# Patient Record
Sex: Female | Born: 1935 | Race: Black or African American | Hispanic: No | Marital: Married | Smoking: Current every day smoker
Health system: Southern US, Community
[De-identification: ages and names within clinical notes are randomized; demographics above are authoritative.]

## PROBLEM LIST (undated history)

## (undated) DIAGNOSIS — D509 Iron deficiency anemia, unspecified: Secondary | ICD-10-CM

## (undated) DIAGNOSIS — M1990 Unspecified osteoarthritis, unspecified site: Secondary | ICD-10-CM

## (undated) DIAGNOSIS — T7840XA Allergy, unspecified, initial encounter: Secondary | ICD-10-CM

## (undated) DIAGNOSIS — M549 Dorsalgia, unspecified: Secondary | ICD-10-CM

## (undated) HISTORY — PX: INNER EAR SURGERY: SHX679

## (undated) HISTORY — DX: Unspecified osteoarthritis, unspecified site: M19.90

## (undated) HISTORY — DX: Allergy, unspecified, initial encounter: T78.40XA

---

## 1998-06-23 ENCOUNTER — Emergency Department (HOSPITAL_COMMUNITY): Admission: EM | Admit: 1998-06-23 | Discharge: 1998-06-23 | Payer: Self-pay | Admitting: Emergency Medicine

## 2010-02-12 ENCOUNTER — Emergency Department (HOSPITAL_BASED_OUTPATIENT_CLINIC_OR_DEPARTMENT_OTHER): Admission: EM | Admit: 2010-02-12 | Discharge: 2010-02-12 | Payer: Self-pay | Admitting: Emergency Medicine

## 2010-02-12 ENCOUNTER — Ambulatory Visit: Payer: Self-pay | Admitting: Radiology

## 2010-06-28 LAB — POCT TOXICOLOGY PANEL

## 2010-06-28 LAB — DIFFERENTIAL
Basophils Absolute: 0 10*3/uL (ref 0.0–0.1)
Eosinophils Absolute: 0.1 10*3/uL (ref 0.0–0.7)
Eosinophils Relative: 1 % (ref 0–5)
Lymphocytes Relative: 33 % (ref 12–46)
Monocytes Absolute: 0.4 10*3/uL (ref 0.1–1.0)

## 2010-06-28 LAB — URINE CULTURE

## 2010-06-28 LAB — BASIC METABOLIC PANEL
BUN: 22 mg/dL (ref 6–23)
CO2: 23 mEq/L (ref 19–32)
Chloride: 109 mEq/L (ref 96–112)
Creatinine, Ser: 0.8 mg/dL (ref 0.4–1.2)
Glucose, Bld: 110 mg/dL — ABNORMAL HIGH (ref 70–99)
Potassium: 4.2 mEq/L (ref 3.5–5.1)

## 2010-06-28 LAB — CBC
HCT: 45.3 % (ref 36.0–46.0)
MCH: 31.7 pg (ref 26.0–34.0)
MCV: 91.8 fL (ref 78.0–100.0)
Platelets: 200 10*3/uL (ref 150–400)
RDW: 13.2 % (ref 11.5–15.5)
WBC: 5.5 10*3/uL (ref 4.0–10.5)

## 2010-06-28 LAB — URINE MICROSCOPIC-ADD ON

## 2010-06-28 LAB — URINALYSIS, ROUTINE W REFLEX MICROSCOPIC
Glucose, UA: NEGATIVE mg/dL
Ketones, ur: NEGATIVE mg/dL
Nitrite: NEGATIVE
Specific Gravity, Urine: 1.022 (ref 1.005–1.030)
pH: 5.5 (ref 5.0–8.0)

## 2010-06-28 LAB — ETHANOL: Alcohol, Ethyl (B): <5 mg/dL (ref 0–10)

## 2011-09-20 IMAGING — CT CT HEAD W/O CM
1 series · 16 of 30 positions shown, 20 images · non-contrast
Comparison: None.

CLINICAL DATA: Medical clearance.  Mental status changes.

CT HEAD WITHOUT CONTRAST
TECHNIQUE: Contiguous axial images were obtained from the base of
the skull through the vertex without contrast.

[Series 2: head 4.8 h37s · axial · 0.42mm/px · z∈[-167,-34]mm · 16 of 32 slices shown, 20 images]
[im 2/32  brain]
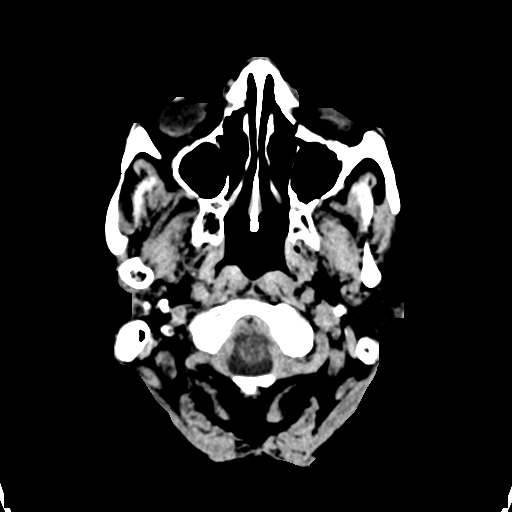
[im 2/32  bone]
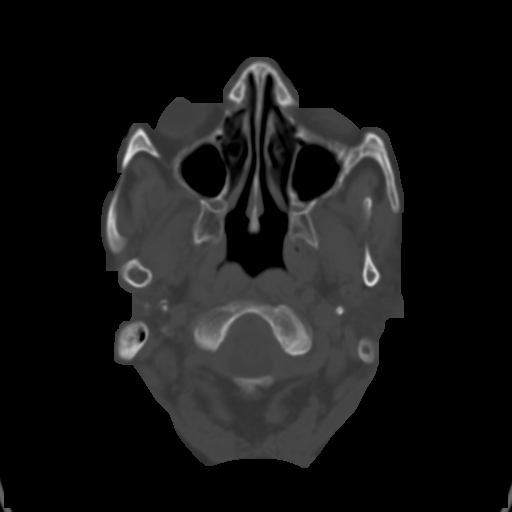
[im 4/32  brain]
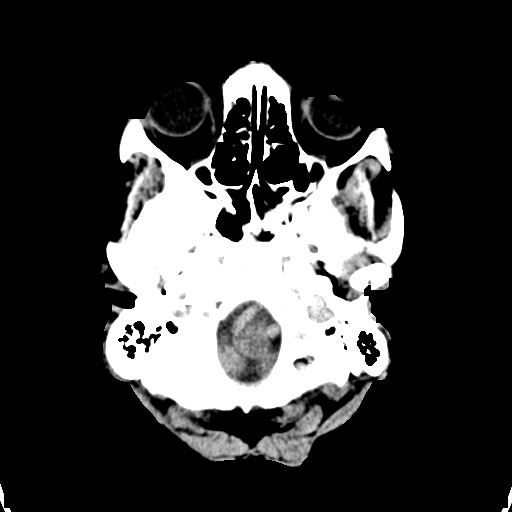
[im 6/32  brain]
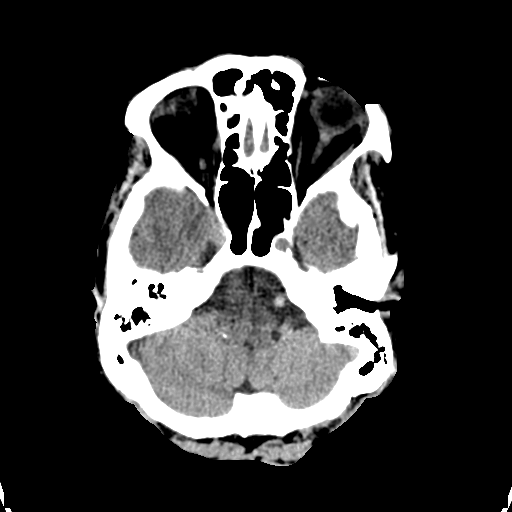
[im 8/32  brain]
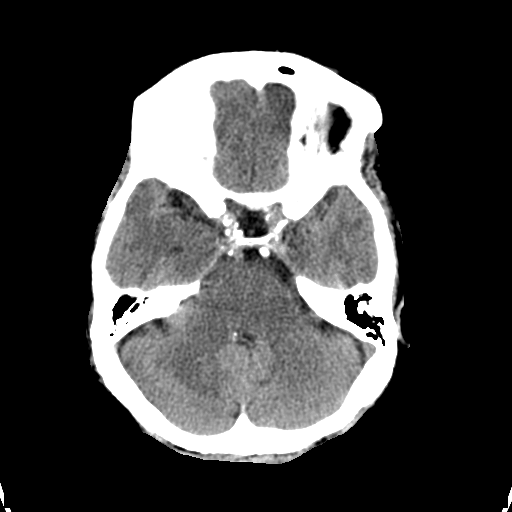
[im 9/32  brain]
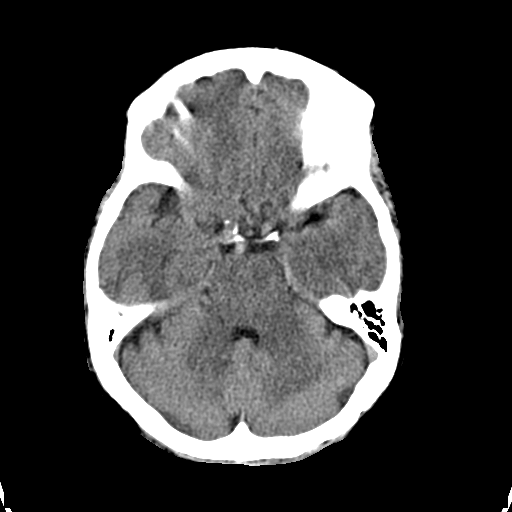
[im 9/32  bone]
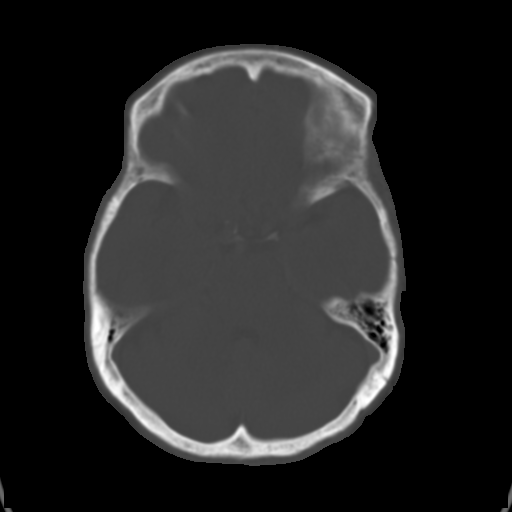
[im 11/32  brain]
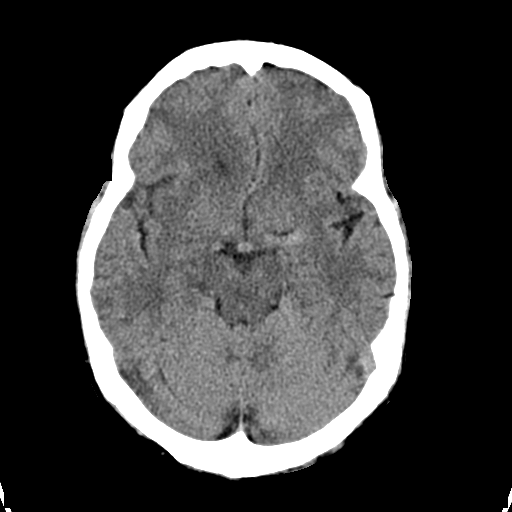
[im 13/32  brain]
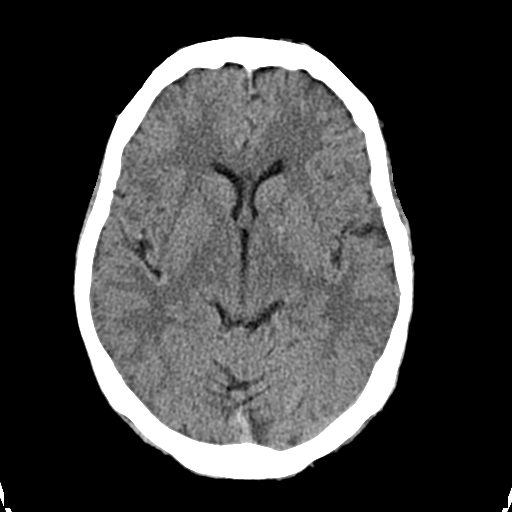
[im 15/32  brain]
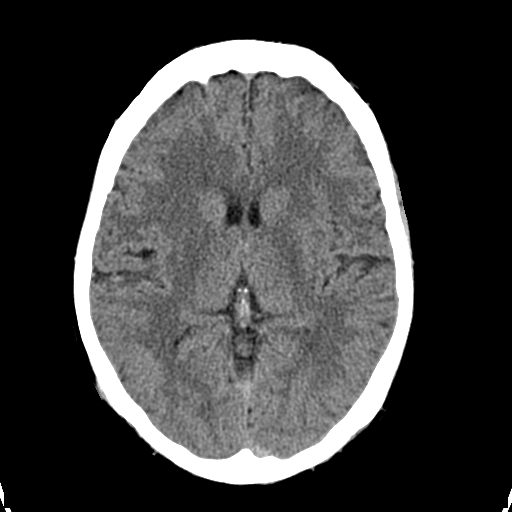
[im 17/32  brain]
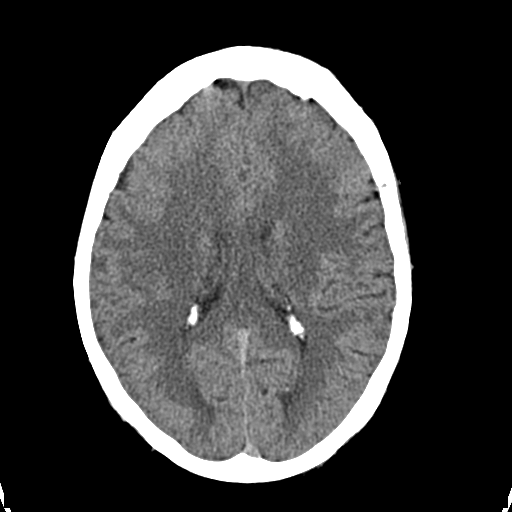
[im 17/32  bone]
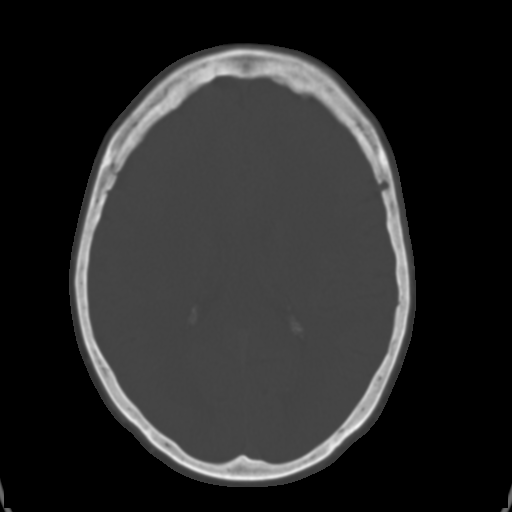
[im 19/32  brain]
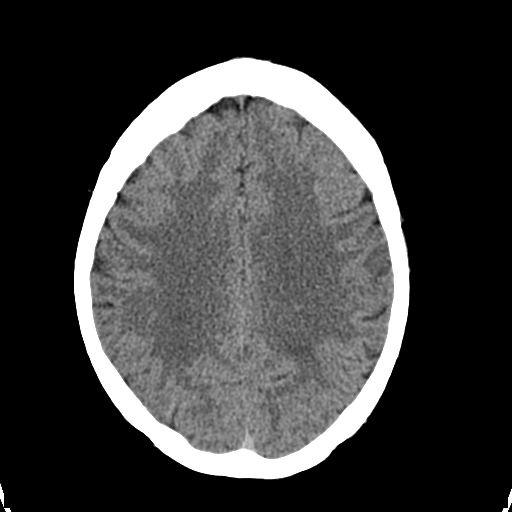
[im 21/32  brain]
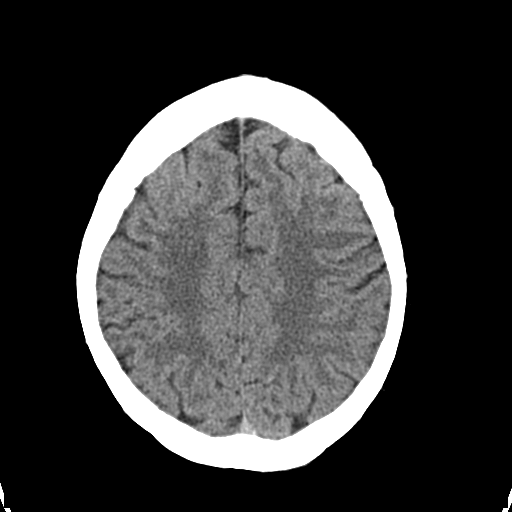
[im 23/32  brain]
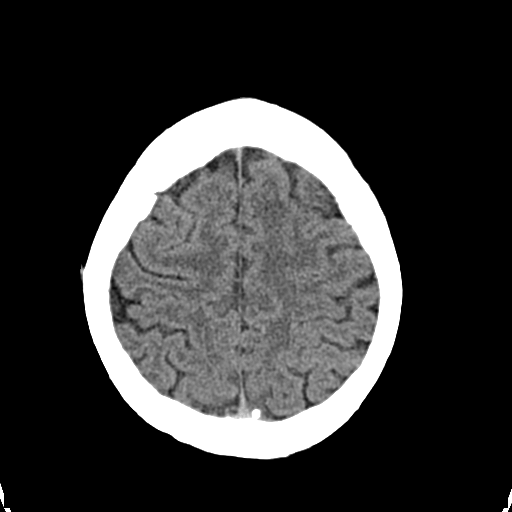
[im 24/32  brain]
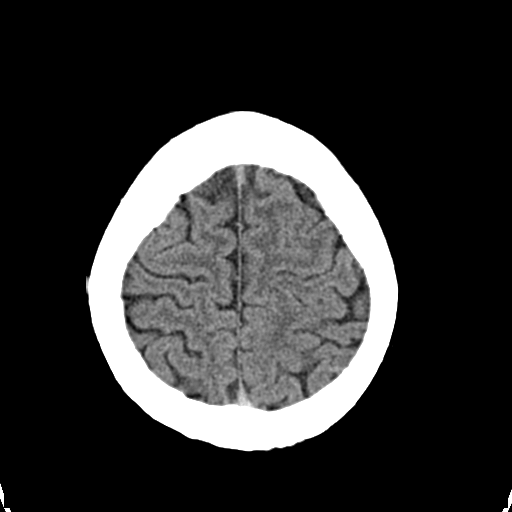
[im 24/32  bone]
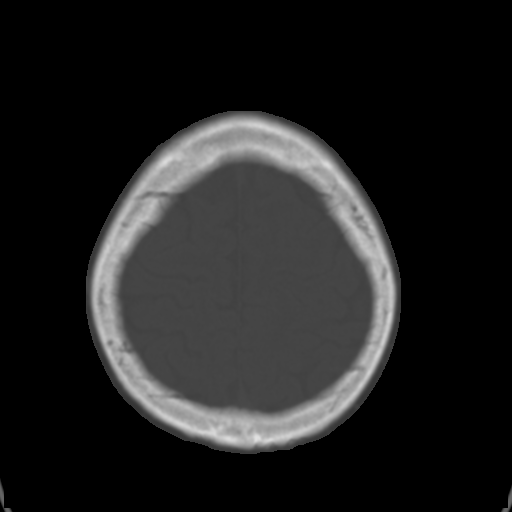
[im 26/32  brain]
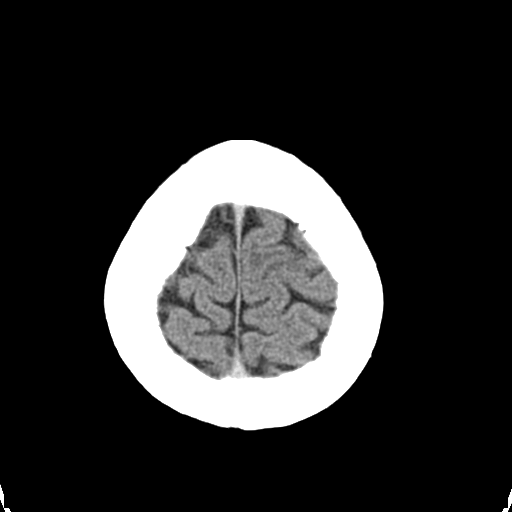
[im 28/32  brain]
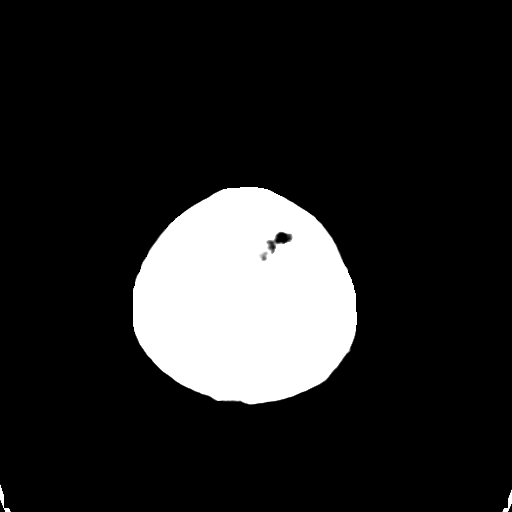
[im 30/32  brain]
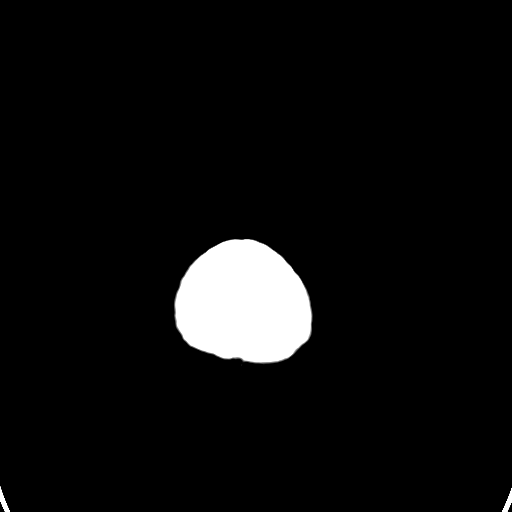

[16 of 30 positions shown; findings below may reference images not displayed]

FINDINGS: Left calvarial lesion may represent a prominent arachnoid
granulation.  Visualized sinuses and mastoid air cells clear.

No intracranial hemorrhage. No CT evidence of large acute infarct.
Small acute infarct cannot be excluded by CT.  No intracranial mass
detected on this unenhanced exam.  Vascular calcifications.
IMPRESSION: No intracranial hemorrhage or CT evidence of large acute infarct.

No intracranial mass detected on this unenhanced exam.

## 2011-10-15 ENCOUNTER — Encounter (HOSPITAL_COMMUNITY): Payer: Self-pay | Admitting: *Deleted

## 2011-10-15 ENCOUNTER — Inpatient Hospital Stay (HOSPITAL_COMMUNITY)
Admission: EM | Admit: 2011-10-15 | Discharge: 2011-10-18 | DRG: 885 | Disposition: A | Discharge status: Home / Self Care | Payer: Medicare Other | Attending: Internal Medicine | Admitting: Internal Medicine

## 2011-10-15 DIAGNOSIS — F29 Unspecified psychosis not due to a substance or known physiological condition: Secondary | ICD-10-CM

## 2011-10-15 DIAGNOSIS — IMO0001 Reserved for inherently not codable concepts without codable children: Secondary | ICD-10-CM | POA: Diagnosis present

## 2011-10-15 DIAGNOSIS — F22 Delusional disorders: Principal | ICD-10-CM | POA: Diagnosis present

## 2011-10-15 DIAGNOSIS — D509 Iron deficiency anemia, unspecified: Secondary | ICD-10-CM | POA: Diagnosis present

## 2011-10-15 DIAGNOSIS — G8929 Other chronic pain: Secondary | ICD-10-CM | POA: Diagnosis present

## 2011-10-15 DIAGNOSIS — M549 Dorsalgia, unspecified: Secondary | ICD-10-CM | POA: Diagnosis present

## 2011-10-15 DIAGNOSIS — R030 Elevated blood-pressure reading, without diagnosis of hypertension: Secondary | ICD-10-CM | POA: Diagnosis present

## 2011-10-15 DIAGNOSIS — D649 Anemia, unspecified: Secondary | ICD-10-CM | POA: Diagnosis present

## 2011-10-15 HISTORY — DX: Dorsalgia, unspecified: M54.9

## 2011-10-15 HISTORY — DX: Iron deficiency anemia, unspecified: D50.9

## 2011-10-15 LAB — COMPREHENSIVE METABOLIC PANEL
Alkaline Phosphatase: 104 U/L (ref 39–117)
BUN: 15 mg/dL (ref 6–23)
CO2: 19 mEq/L (ref 19–32)
Chloride: 111 mEq/L (ref 96–112)
GFR calc Af Amer: 61 mL/min — ABNORMAL LOW (ref >90)
GFR calc non Af Amer: 53 mL/min — ABNORMAL LOW (ref >90)
Glucose, Bld: 92 mg/dL (ref 70–99)
Potassium: 3.8 mEq/L (ref 3.5–5.1)
Total Bilirubin: 0.1 mg/dL — ABNORMAL LOW (ref 0.3–1.2)

## 2011-10-15 LAB — CBC
HCT: 21.1 % — ABNORMAL LOW (ref 36.0–46.0)
Hemoglobin: 6.3 g/dL — CL (ref 12.0–15.0)
MCH: 24.7 pg — ABNORMAL LOW (ref 26.0–34.0)
MCV: 83.7 fL (ref 78.0–100.0)
Platelets: 432 10*3/uL — ABNORMAL HIGH (ref 150–400)
RBC: 2.55 MIL/uL — ABNORMAL LOW (ref 3.87–5.11)
WBC: 5 10*3/uL (ref 4.0–10.5)
WBC: 4.9 10*3/uL (ref 4.0–10.5)

## 2011-10-15 LAB — TYPE AND SCREEN
ABO/RH(D): O POS
Antibody Screen: NEGATIVE

## 2011-10-15 LAB — RETICULOCYTES
RBC.: 2.41 MIL/uL — ABNORMAL LOW (ref 3.87–5.11)
Retic Ct Pct: 3.7 % — ABNORMAL HIGH (ref 0.4–3.1)

## 2011-10-15 LAB — RAPID URINE DRUG SCREEN, HOSP PERFORMED: Amphetamines: NOT DETECTED

## 2011-10-15 LAB — ETHANOL: Alcohol, Ethyl (B): <11 mg/dL (ref 0–11)

## 2011-10-15 MED ORDER — ACETAMINOPHEN 650 MG RE SUPP: 650.0000 mg | Freq: Four times a day (QID) | RECTAL | Status: DC | PRN

## 2011-10-15 MED ORDER — ZIPRASIDONE HCL 20 MG PO CAPS
20.0000 mg | ORAL_CAPSULE | Freq: Two times a day (BID) | ORAL | Status: DC
  Administered 2011-10-15 – 2011-10-16 (×2): 20 mg via ORAL
  Filled 2011-10-15 (×8): qty 1

## 2011-10-15 MED ORDER — HALOPERIDOL LACTATE 5 MG/ML IJ SOLN: 2.0000 mg | Freq: Four times a day (QID) | INTRAMUSCULAR | Status: DC | PRN

## 2011-10-15 MED ORDER — ONDANSETRON HCL 4 MG/2ML IJ SOLN: 4.0000 mg | Freq: Four times a day (QID) | INTRAMUSCULAR | Status: DC | PRN

## 2011-10-15 MED ORDER — HYDRALAZINE HCL 20 MG/ML IJ SOLN: 10.0000 mg | Freq: Four times a day (QID) | INTRAMUSCULAR | Status: DC | PRN

## 2011-10-15 MED ORDER — SODIUM CHLORIDE 0.9 % IV SOLN
INTRAVENOUS | Status: DC
  Administered 2011-10-16: 01:00:00 via INTRAVENOUS

## 2011-10-15 MED ORDER — OXYCODONE HCL 5 MG PO TABS: 5.0000 mg | ORAL_TABLET | ORAL | Status: DC | PRN

## 2011-10-15 MED ORDER — HYDROMORPHONE HCL PF 1 MG/ML IJ SOLN: 0.5000 mg | INTRAMUSCULAR | Status: DC | PRN

## 2011-10-15 MED ORDER — ALUM & MAG HYDROXIDE-SIMETH 200-200-20 MG/5ML PO SUSP: 30.0000 mL | Freq: Four times a day (QID) | ORAL | Status: DC | PRN

## 2011-10-15 MED ORDER — ONDANSETRON HCL 4 MG PO TABS: 4.0000 mg | ORAL_TABLET | Freq: Four times a day (QID) | ORAL | Status: DC | PRN

## 2011-10-15 MED ORDER — BENZTROPINE MESYLATE 1 MG PO TABS
1.0000 mg | ORAL_TABLET | Freq: Two times a day (BID) | ORAL | Status: DC
  Administered 2011-10-16: 1 mg via ORAL
  Filled 2011-10-15 (×7): qty 1

## 2011-10-15 MED ORDER — BENZTROPINE MESYLATE 1 MG/ML IJ SOLN
1.0000 mg | Freq: Two times a day (BID) | INTRAMUSCULAR | Status: DC
  Filled 2011-10-15 (×7): qty 1

## 2011-10-15 MED ORDER — ACETAMINOPHEN 325 MG PO TABS
650.0000 mg | ORAL_TABLET | Freq: Four times a day (QID) | ORAL | Status: DC | PRN
  Administered 2011-10-16 – 2011-10-17 (×3): 650 mg via ORAL
  Administered 2011-10-18: 325 mg via ORAL
  Filled 2011-10-15: qty 1
  Filled 2011-10-15 (×3): qty 2

## 2011-10-15 MED ORDER — PANTOPRAZOLE SODIUM 40 MG IV SOLR
40.0000 mg | Freq: Two times a day (BID) | INTRAVENOUS | Status: DC
  Administered 2011-10-16: 40 mg via INTRAVENOUS
  Filled 2011-10-15 (×7): qty 40
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
## 2011-10-15 NOTE — ED Notes (Signed)
WUJ:[id number]<JY> Expected date:10/15/11<BR> Expected time: 8:09 AM<BR> Means of arrival:<BR> Comments:<BR> closed

## 2011-10-15 NOTE — ED Notes (Signed)
Dr. Denton Lank notified of pts Hgb.
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
## 2011-10-15 NOTE — ED Notes (Signed)
Pt. Refused blood draw.

## 2011-10-15 NOTE — ED Notes (Signed)
Pt. refused to have blood drawn. Nurse is aware.

## 2011-10-15 NOTE — ED Notes (Signed)
MD at bedside. 
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
## 2011-10-15 NOTE — ED Notes (Signed)
Pt refused to have additional blood work drawn, refusing to have rectum checked for occult blood.

## 2011-10-15 NOTE — ED Notes (Signed)
Patient and belongings wanded by security. One bag of belongings placed behind triage nursing station. Patient has a pair of pants, a pair of socks, a pair of shoes, one hair tie, 3 gold rings, a pair of shorts, one pair or underwear, a bra, one head scarf, a shirt, and a sweater. Patient changed into blue scrubs and red socks.

## 2011-10-15 NOTE — ED Notes (Signed)
Tele-psych faxed, camera ready in room. Tele-psych to be completed when dr phones in.

## 2011-10-15 NOTE — ED Notes (Signed)
Telepsych in progress. 

## 2011-10-15 NOTE — ED Notes (Signed)
Family at bedside with pt.
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
## 2011-10-15 NOTE — ED Notes (Signed)
Pt states "I would know if I was bleeding, it would be in my pants. I know how to go see a dr if I need to. Y'all don't need to check my bottom."

## 2011-10-15 NOTE — Progress Notes (Signed)
ED CM noted no pcp and no coverage.  ED CM inquired if pt had a pcp and she stated she did not have a pcp but would except a list of available providers.  She ws provided with a list of [place] providers
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
## 2011-10-16 DIAGNOSIS — F29 Unspecified psychosis not due to a substance or known physiological condition: Secondary | ICD-10-CM

## 2011-10-16 DIAGNOSIS — F09 Unspecified mental disorder due to known physiological condition: Secondary | ICD-10-CM

## 2011-10-16 LAB — HEMOGLOBIN AND HEMATOCRIT, BLOOD: Hemoglobin: 5.9 g/dL — CL (ref 12.0–15.0)

## 2011-10-16 LAB — VITAMIN B12: Vitamin B-12: 393 pg/mL (ref 211–911)

## 2011-10-16 LAB — IRON AND TIBC: UIBC: 355 ug/dL (ref 125–400)

## 2011-10-16 LAB — BASIC METABOLIC PANEL
CO2: 20 mEq/L (ref 19–32)
Calcium: 8.5 mg/dL (ref 8.4–10.5)
Creatinine, Ser: 0.86 mg/dL (ref 0.50–1.10)
GFR calc Af Amer: 76 mL/min — ABNORMAL LOW (ref >90)
GFR calc non Af Amer: 65 mL/min — ABNORMAL LOW (ref >90)
Sodium: 140 mEq/L (ref 135–145)

## 2011-10-16 LAB — CBC
MCV: 84.3 fL (ref 78.0–100.0)
Platelets: 350 10*3/uL (ref 150–400)
RBC: 2.35 MIL/uL — ABNORMAL LOW (ref 3.87–5.11)
RDW: 17.9 % — ABNORMAL HIGH (ref 11.5–15.5)
WBC: 4.3 10*3/uL (ref 4.0–10.5)

## 2011-10-16 LAB — OCCULT BLOOD X 1 CARD TO LAB, STOOL: Fecal Occult Bld: NEGATIVE

## 2011-10-16 NOTE — Progress Notes (Signed)
CARE MANAGEMENT NOTE 10/16/2011  Patient:  Julie Charles, Julie Charles   Account Number:  [id number]  Date Initiated:  10/16/2011  Documentation initiated by:  Denetta Fei  Subjective/Objective Assessment:   PATIENT brought in under IVC orders due to bizzare behavior and paranoia, pt hgb on admission 5.5.     Action/Plan:   lives at home but may need psych placement  ivc expires on [id number]   Anticipated DC Date:  10/19/2011   Anticipated DC Plan:  [facility]  In-house referral  Clinical Social Worker      DC Planning Services  NA      [facility]  NA   Choice offered to / List presented to:  NA   DME arranged  NA      DME agency  NA     HH arranged  NA      HH agency  NA   Status of service:  In process, will continue to follow Medicare Important Message given?  NA - LOS <3 / Initial given by admissions (If response is "NO", the following Medicare IM given date fields will be blank) Date Medicare IM given:   Date Additional Medicare IM given:    Discharge Disposition:    Per UR Regulation:  Reviewed for med. necessity/level of care/duration of stay  If discussed at Long Length of Stay Meetings, dates discussed:    Comments:  [id number]/Sidney Kann Earlene Plater, RN, BSN, CCM Chart and patient information reviewed no discharge needs present at this time. Case Management [phone]

## 2011-10-16 NOTE — Progress Notes (Signed)
Patient is disgruntled about her admission to hospital and is refusing her medications and IV fluids.  She states she is "not stupid or crazy" and will hire a lawyer to get her out of this hospital.  She states her blood count is low due to having her blood drawn twice since she has been admitted.  She wants real food to eat, assured her I would speak with her doctor about changing her diet.
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
## 2011-10-16 NOTE — Progress Notes (Signed)
Patient's grandson is present and requesting to see Dr. Suanne Marker.  Notified doctor who will be here in approximately 20 minutes.  Marchelle Folks, CSW, also here speaking to grandson regarding his issues with her admission.  He also brought patient food but doctor has not changed patient's diet order from clear liquids.  This has been explained to the grandson.

## 2011-10-16 NOTE — Progress Notes (Signed)
Pt calm, but still refusing most treatments. She is still refusing a blood transfusion, but agreed to take her HS meds and IVF.  She does not want to wear her SCDs, but they were placed at the bedside in case she changed her mind. New MD order for a safety sitter, so the [facility] was notified.  No sitters available at this time, but staff will continue to monitor pt for any safety issues or agressiveness.  Sitter to be obtained in AM if possible.
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
## 2011-10-16 NOTE — Progress Notes (Signed)
Patient continues to refuse medication and treatment  Julie Charles 10/16/2011 RN
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
## 2011-10-16 NOTE — Progress Notes (Signed)
Patient refusing medications and IV fluids
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
## 2011-10-17 ENCOUNTER — Encounter (HOSPITAL_COMMUNITY): Payer: Self-pay | Admitting: Internal Medicine

## 2011-10-17 DIAGNOSIS — R030 Elevated blood-pressure reading, without diagnosis of hypertension: Secondary | ICD-10-CM

## 2011-10-17 DIAGNOSIS — F22 Delusional disorders: Principal | ICD-10-CM

## 2011-10-17 DIAGNOSIS — D509 Iron deficiency anemia, unspecified: Secondary | ICD-10-CM

## 2011-10-17 HISTORY — DX: Iron deficiency anemia, unspecified: D50.9

## 2011-10-17 LAB — HEMOGLOBIN AND HEMATOCRIT, BLOOD: Hemoglobin: 6.1 g/dL — CL (ref 12.0–15.0)

## 2011-10-17 MED ORDER — FERROUS SULFATE 325 (65 FE) MG PO TABS
325.0000 mg | ORAL_TABLET | Freq: Three times a day (TID) | ORAL | Status: DC
  Administered 2011-10-18: 325 mg via ORAL
  Filled 2011-10-17 (×5): qty 1

## 2011-10-17 NOTE — Progress Notes (Signed)
Patient refused afternoon vitals.

## 2011-10-17 NOTE — Progress Notes (Signed)
Patient refuses lab draws this morning.  Dr. Janee Morn notified.
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
## 2011-10-17 NOTE — Progress Notes (Signed)
Subjective: Patient c/o of her sinuses. Patient refusing all lab draws and medications and transfusions.  Objective: Vital signs in last 24 hours: Filed Vitals:   10/16/11 1021 10/16/11 1453 10/16/11 2125 10/17/11 0620  BP: 172/74 165/84 163/83 133/64  Pulse: 69 74 74 69  Temp: 98.5 F (36.9 C) 97.8 F (36.6 C) 97.7 F (36.5 C) 97.5 F (36.4 C)  TempSrc: Oral Oral Oral Oral  Resp: 20 22 20 18   Height:      Weight:      SpO2: 100% 98% 98% 100%    Intake/Output Summary (Last 24 hours) at 10/17/11 1703 Last data filed at 10/17/11 1500  Gross per 24 hour  Intake    720 ml  Output    401 ml  Net    319 ml    Weight change:   General: Alert, awake, oriented x3, in no acute distress. HEENT: No bruits, no goiter. Heart: Regular rate and rhythm, without murmurs, rubs, gallops. Lungs: Clear to auscultation bilaterally. Abdomen: Soft, nontender, nondistended, positive bowel sounds. Extremities: No clubbing cyanosis or edema with positive pedal pulses.    Lab Results:  [facility] 10/16/11 0453 10/15/11 1525  NA 140 142  K 3.6 3.8  CL 111 111  CO2 20 19  GLUCOSE 90 92  BUN 14 15  CREATININE 0.86 1.03  CALCIUM 8.5 9.2  MG -- --  PHOS -- --    Basename 10/15/11 1525  AST 15  ALT 9  ALKPHOS 104  BILITOT 0.1*  PROT 7.2  ALBUMIN 3.7   No results found for this basename: LIPASE:2,AMYLASE:2 in the last 72 hours  Basename 10/16/11 2357 10/16/11 0453 10/15/11 1613  WBC -- 4.3 4.9  NEUTROABS -- -- --  HGB 6.1* 5.8* --  HCT 21.1* 19.8* --  MCV -- 84.3 84.3  PLT -- 350 432*   No results found for this basename: CKTOTAL:3,CKMB:3,CKMBINDEX:3,TROPONINI:3 in the last 72 hours No components found with this basename: POCBNP:3 No results found for this basename: DDIMER:2 in the last 72 hours No results found for this basename: HGBA1C:2 in the last 72 hours No results found for this basename: CHOL:2,HDL:2,LDLCALC:2,TRIG:2,CHOLHDL:2,LDLDIRECT:2 in the last 72 hours No  results found for this basename: TSH,T4TOTAL,FREET3,T3FREE,THYROIDAB in the last 72 hours  Basename 10/15/11 2315  VITAMINB12 393  FOLATE 17.1  FERRITIN 9*  TIBC Not calculated due to Iron <10.  IRON <10*  RETICCTPCT 3.7*    Micro Results: No results found for this or any previous visit (from the past 240 hour(s)).  Studies/Results: No results found.  Medications:     . benztropine  1 mg Oral BID   Or  . benztropine mesylate  1 mg Intramuscular BID  . pantoprazole (PROTONIX) IV  40 mg Intravenous Q12H  . ziprasidone  20 mg Oral BID WC    Assessment: Active Problems:  Paranoia (psychosis)  Anemia  Elevated blood pressure  Chronic back pain  Iron deficiency anemia   Plan: #1 paranoia Patient states that the premise with which she was brought here was a lie. Patient states that she was not throwing anything at anybody. Patient has been seen by psychiatry and state patient has significant cognitive deficits. They feel patient is not going to benefit from psyche patient treatment versus outpatient treatment now. Geodon was recommended to help with her paranoid thoughts however patient refusing medications. Patient has refused TSH. Psych is following. Per psych patient for outpatient psych followup to complete workup for cognition.   #2 severe iron  deficiency anemia FOBT is negative. Patient is refusing any further workup. Patient denies any symptoms. Will place patient on oral iron tablets. Patient will need to followup as outpatient.  #3 elevated blood pressure Stable improved.  #4 chronic back pain Pain management.     LOS: 2 days   Malachai Schalk [phone] 10/17/2011, 5:03 PM
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
## 2011-10-17 NOTE — Progress Notes (Signed)
Patient is still refusing all labs, IV sticks, medications, scds, and blood.  MD aware.

## 2011-10-18 DIAGNOSIS — M549 Dorsalgia, unspecified: Secondary | ICD-10-CM

## 2011-10-18 MED ORDER — FERROUS SULFATE 325 (65 FE) MG PO TABS: 325.0000 mg | ORAL_TABLET | Freq: Three times a day (TID) | ORAL | Status: DC

## 2011-10-18 MED ORDER — DOCUSATE SODIUM 100 MG PO CAPS: 100.0000 mg | ORAL_CAPSULE | Freq: Two times a day (BID) | ORAL | Status: AC
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
## 2011-10-18 NOTE — Progress Notes (Signed)
Pt cleared by psych MD.  Notified MD, Pt and family.  Provided Pt with outpt mental health information.  Family to arrive at noon to pick up Pt.  Rescinded IVC documents.  Pt to be d/c'd.  Providence Crosby, LCSWA Clinical Social Work [phone]
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
## 2014-03-22 ENCOUNTER — Emergency Department (HOSPITAL_COMMUNITY): Admission: EM | Admit: 2014-03-22 | Discharge: 2014-03-22 | Discharge status: Absent without leave | Payer: Self-pay

## 2014-03-24 ENCOUNTER — Ambulatory Visit (INDEPENDENT_AMBULATORY_CARE_PROVIDER_SITE_OTHER): Payer: Medicare Other

## 2014-03-24 ENCOUNTER — Ambulatory Visit (INDEPENDENT_AMBULATORY_CARE_PROVIDER_SITE_OTHER): Payer: Medicare Other | Admitting: Emergency Medicine

## 2014-03-24 VITALS — BP 154/90 | HR 89 | Temp 98.1°F | Resp 16 | Ht 64.0 in | Wt 131.0 lb

## 2014-03-24 DIAGNOSIS — M21061 Valgus deformity, not elsewhere classified, right knee: Secondary | ICD-10-CM

## 2014-03-24 DIAGNOSIS — M25561 Pain in right knee: Secondary | ICD-10-CM

## 2014-03-24 MED ORDER — MELOXICAM 15 MG PO TABS: 15.0000 mg | ORAL_TABLET | Freq: Every day | ORAL | Status: DC
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
## 2014-03-24 NOTE — Patient Instructions (Signed)

## 2014-06-10 ENCOUNTER — Emergency Department (HOSPITAL_COMMUNITY): Payer: Medicare Other

## 2014-06-10 ENCOUNTER — Inpatient Hospital Stay (HOSPITAL_COMMUNITY)
Admission: EM | Admit: 2014-06-10 | Discharge: 2014-06-12 | DRG: 293 | Disposition: A | Discharge status: Home / Self Care | Payer: Medicare Other | Attending: Internal Medicine | Admitting: Internal Medicine

## 2014-06-10 ENCOUNTER — Encounter (HOSPITAL_COMMUNITY): Payer: Self-pay | Admitting: Emergency Medicine

## 2014-06-10 DIAGNOSIS — I509 Heart failure, unspecified: Secondary | ICD-10-CM

## 2014-06-10 DIAGNOSIS — R0602 Shortness of breath: Secondary | ICD-10-CM | POA: Diagnosis present

## 2014-06-10 DIAGNOSIS — I5021 Acute systolic (congestive) heart failure: Secondary | ICD-10-CM

## 2014-06-10 DIAGNOSIS — E0500 Thyrotoxicosis with diffuse goiter without thyrotoxic crisis or storm: Secondary | ICD-10-CM | POA: Diagnosis present

## 2014-06-10 DIAGNOSIS — Z880 Allergy status to penicillin: Secondary | ICD-10-CM

## 2014-06-10 DIAGNOSIS — Z7982 Long term (current) use of aspirin: Secondary | ICD-10-CM

## 2014-06-10 DIAGNOSIS — R748 Abnormal levels of other serum enzymes: Secondary | ICD-10-CM | POA: Diagnosis present

## 2014-06-10 DIAGNOSIS — R600 Localized edema: Secondary | ICD-10-CM

## 2014-06-10 DIAGNOSIS — R109 Unspecified abdominal pain: Secondary | ICD-10-CM | POA: Diagnosis present

## 2014-06-10 DIAGNOSIS — I5023 Acute on chronic systolic (congestive) heart failure: Secondary | ICD-10-CM | POA: Diagnosis not present

## 2014-06-10 DIAGNOSIS — G8929 Other chronic pain: Secondary | ICD-10-CM | POA: Diagnosis present

## 2014-06-10 DIAGNOSIS — Z720 Tobacco use: Secondary | ICD-10-CM | POA: Diagnosis present

## 2014-06-10 DIAGNOSIS — F17210 Nicotine dependence, cigarettes, uncomplicated: Secondary | ICD-10-CM | POA: Diagnosis present

## 2014-06-10 LAB — COMPREHENSIVE METABOLIC PANEL
ALBUMIN: 3.1 g/dL — AB (ref 3.5–5.2)
ALK PHOS: 130 U/L — AB (ref 39–117)
ALT: 58 U/L — ABNORMAL HIGH (ref 0–35)
AST: 44 U/L — ABNORMAL HIGH (ref 0–37)
Anion gap: 14 (ref 5–15)
BILIRUBIN TOTAL: 1.1 mg/dL (ref 0.3–1.2)
BUN: 24 mg/dL — ABNORMAL HIGH (ref 6–23)
CO2: 17 mmol/L — ABNORMAL LOW (ref 19–32)
CREATININE: 0.79 mg/dL (ref 0.50–1.10)
Calcium: 9.3 mg/dL (ref 8.4–10.5)
Chloride: 112 mmol/L (ref 96–112)
GFR, EST NON AFRICAN AMERICAN: 79 mL/min — AB (ref >90)
GLUCOSE: 130 mg/dL — AB (ref 70–99)
Potassium: 3.6 mmol/L (ref 3.5–5.1)
Sodium: 143 mmol/L (ref 135–145)
TOTAL PROTEIN: 5.9 g/dL — AB (ref 6.0–8.3)

## 2014-06-10 LAB — CBC WITH DIFFERENTIAL/PLATELET
BASOS ABS: 0 10*3/uL (ref 0.0–0.1)
Basophils Relative: 1 % (ref 0–1)
Eosinophils Absolute: 0.1 10*3/uL (ref 0.0–0.7)
Eosinophils Relative: 3 % (ref 0–5)
HEMATOCRIT: 41.8 % (ref 36.0–46.0)
HEMOGLOBIN: 13.6 g/dL (ref 12.0–15.0)
LYMPHS PCT: 46 % (ref 12–46)
Lymphs Abs: 1.9 10*3/uL (ref 0.7–4.0)
MCH: 28.3 pg (ref 26.0–34.0)
MCHC: 32.5 g/dL (ref 30.0–36.0)
MCV: 87.1 fL (ref 78.0–100.0)
MONOS PCT: 8 % (ref 3–12)
Monocytes Absolute: 0.3 10*3/uL (ref 0.1–1.0)
NEUTROS ABS: 1.8 10*3/uL (ref 1.7–7.7)
NEUTROS PCT: 42 % — AB (ref 43–77)
Platelets: 186 10*3/uL (ref 150–400)
RBC: 4.8 MIL/uL (ref 3.87–5.11)
RDW: 16 % — ABNORMAL HIGH (ref 11.5–15.5)
WBC: 4.2 10*3/uL (ref 4.0–10.5)

## 2014-06-10 LAB — URINALYSIS, ROUTINE W REFLEX MICROSCOPIC
Glucose, UA: NEGATIVE mg/dL
Hgb urine dipstick: NEGATIVE
KETONES UR: 15 mg/dL — AB
LEUKOCYTES UA: NEGATIVE
NITRITE: NEGATIVE
PH: 5 (ref 5.0–8.0)
Protein, ur: 100 mg/dL — AB
SPECIFIC GRAVITY, URINE: 1.03 (ref 1.005–1.030)
Urobilinogen, UA: 1 mg/dL (ref 0.0–1.0)

## 2014-06-10 LAB — I-STAT TROPONIN, ED: TROPONIN I, POC: 0.04 ng/mL (ref 0.00–0.08)

## 2014-06-10 LAB — CBG MONITORING, ED: Glucose-Capillary: 111 mg/dL — ABNORMAL HIGH (ref 70–99)

## 2014-06-10 LAB — URINE MICROSCOPIC-ADD ON

## 2014-06-10 LAB — BRAIN NATRIURETIC PEPTIDE: B Natriuretic Peptide: 2126.1 pg/mL — ABNORMAL HIGH (ref 0.0–100.0)

## 2014-06-10 MED ORDER — FUROSEMIDE 10 MG/ML IJ SOLN
40.0000 mg | Freq: Once | INTRAMUSCULAR | Status: AC
  Administered 2014-06-10: 40 mg via INTRAVENOUS
  Filled 2014-06-10: qty 4
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
## 2014-06-10 NOTE — ED Provider Notes (Signed)
CSN: [id number]     Arrival date & time 06/10/14  2057 History   First MD Initiated Contact with Patient 06/10/14 2109     Chief Complaint  Patient presents with  . Skin Problem  . Altered Mental Status     (Consider location/radiation/quality/duration/timing/severity/associated sxs/prior Treatment) HPI Julie Charles is a 79 y.o. female who comes from an urgent care facility and [facility] for evaluation of DVT. She does not have regular medical follow-up. Son reports that patient called him today and told that she was increasingly short of breath. Patient cannot recall how long she has felt this way. She also reports associated orthopnea as well as bilateral swelling in her legs. She does report trying to wear a knee brace regularly to help with the swelling. No other interventions tried to improve her symptoms. She reports intermittent abdominal pain depending on her position. She denies chest pain, nausea or vomiting, dizziness or syncope.  History reviewed. No pertinent past medical history. Past Surgical History  Procedure Laterality Date  . Inner ear surgery     No family history on file. History  Substance Use Topics  . Smoking status: Current Every Day Smoker -- 0.50 packs/day    Types: Cigarettes  . Smokeless tobacco: Not on file  . Alcohol Use: No   OB History    No data available     Review of Systems  All other systems reviewed and are negative.   A 10 point review of systems was completed and was negative except for pertinent positives and negatives as mentioned in the history of present illness    Allergies  Penicillins  Home Medications   Prior to Admission medications   Not on File   BP 147/82 mmHg  Pulse 108  Temp(Src) 97.9 F (36.6 C) (Oral)  Resp 20  SpO2 98% Physical Exam  Constitutional: She is oriented to person, place, and time. She appears well-developed and well-nourished. No distress.  HENT:  Head: Normocephalic and atraumatic.   Mouth/Throat: Oropharynx is clear and moist.  Eyes: Conjunctivae are normal. Pupils are equal, round, and reactive to light. Right eye exhibits no discharge. Left eye exhibits no discharge. No scleral icterus.  Neck: Normal range of motion. Neck supple. JVD present. No tracheal deviation present.  Cardiovascular: Normal rate, regular rhythm and normal heart sounds.   Pulmonary/Chest: No respiratory distress. She has no wheezes. She has no rales.  Patient is slightly tachypneic. Bilateral basilar rales. No other adventitious lung sounds. Chest wall rises symmetrically with respiration. Maintaining oxygen saturations greater than 95% on room air.  Abdominal: Soft. There is no tenderness.  Musculoskeletal: She exhibits no tenderness.  Bilateral lower extremity, pretibial edema 2+. Extremities are cool to the touch, distal pulses are intact. No overt erythema noted.  Neurological: She is alert and oriented to person, place, and time.  Cranial Nerves II-XII grossly intact  Skin: Skin is warm and dry. No rash noted. She is not diaphoretic.  Psychiatric: She has a normal mood and affect.  Nursing note and vitals reviewed.   ED Course  Procedures (including critical care time) Labs Review Labs Reviewed  CBC WITH DIFFERENTIAL/PLATELET - Abnormal; Notable for the following:    RDW 16.0 (*)    Neutrophils Relative % 42 (*)    All other components within normal limits  COMPREHENSIVE METABOLIC PANEL - Abnormal; Notable for the following:    CO2 17 (*)    Glucose, Bld 130 (*)    BUN 24 (*)  Total Protein 5.9 (*)    Albumin 3.1 (*)    AST 44 (*)    ALT 58 (*)    Alkaline Phosphatase 130 (*)    GFR calc non Af Amer 79 (*)    All other components within normal limits  URINALYSIS, ROUTINE W REFLEX MICROSCOPIC - Abnormal; Notable for the following:    Color, Urine AMBER (*)    APPearance CLOUDY (*)    Bilirubin Urine SMALL (*)    Ketones, ur 15 (*)    Protein, ur 100 (*)    All other  components within normal limits  BRAIN NATRIURETIC PEPTIDE - Abnormal; Notable for the following:    B Natriuretic Peptide 2126.1 (*)    All other components within normal limits  URINE MICROSCOPIC-ADD ON - Abnormal; Notable for the following:    Squamous Epithelial / LPF FEW (*)    Casts GRANULAR CAST (*)    Crystals CA OXALATE CRYSTALS (*)    All other components within normal limits  CBG MONITORING, ED - Abnormal; Notable for the following:    Glucose-Capillary 111 (*)    All other components within normal limits  I-STAT TROPOININ, ED    Imaging Review Dg Chest 2 View  06/10/2014   CLINICAL DATA:  Shortness of breath and cough. Redness in the lower right leg.  EXAM: CHEST  2 VIEW  COMPARISON:  None.  FINDINGS: Cardiac enlargement with mild pulmonary vascular congestion. Diffuse interstitial changes in the lungs likely representing edema. Increased opacity in the right lung base may represent superimposed pneumonia or asymmetric edema. Multifocal pneumonia not excluded. No blunting of costophrenic angles. No pneumothorax. Calcified and tortuous aorta appear  IMPRESSION: Cardiac enlargement with pulmonary vascular congestion and interstitial infiltrates likely due to edema. Asymmetric edema versus superimposed infiltration in the right lung base.   Electronically Signed   By: Burman NievesWilliam  Stevens M.D.   On: 06/10/2014 21:58     EKG Interpretation   Date/Time:  Thursday June 10 2014 21:10:10 EST Ventricular Rate:  132 PR Interval:  119 QRS Duration: 83 QT Interval:  312 QTC Calculation: 462 R Axis:   101 Text Interpretation:  Sinus tachycardia Multiform ventricular premature  complexes Anterior infarct, old Baseline wander in lead(s) II III aVL aVF  V2 V6 Abnormal ekg No old tracing to compare Confirmed by MILLER  MD,  BRIAN ([id number]) on 06/10/2014 10:23:19 PM     Meds given in ED:  Medications  furosemide (LASIX) injection 40 mg (40 mg Intravenous Given 06/10/14 2317)  iohexol  (OMNIPAQUE) 350 MG/ML injection 100 mL (100 mLs Intravenous Contrast Given 06/11/14 0001)    New Prescriptions   No medications on file   Filed Vitals:   06/10/14 2130 06/10/14 2230 06/10/14 2245 06/10/14 2300  BP: 133/101 143/102 106/71 147/82  Pulse: 116 114 108 108  Temp:      TempSrc:      Resp:  20 23 20   SpO2: 93% 96% 97% 98%    MDM  Vitals stable - WNL -afebrile, no hypoxia Pt admitted to hospital service for acute CHF exacerbation. PE-JVD to angle of the mandible, bilateral pretibial edema, bilateral basilar rales. Labwork-BNP 2126, initial troponin negative, EKG not concerning for acute process. Imaging-chest x-ray shows cardiac enlargement with pulmonary vascular congestion likely secondary to edema. Asymmetric edema versus superimposed filtration and right lung base.  Patient started on IV Lasix in ED. CT angiogram ordered for further evaluation of PE. Discussed with attending, Dr. Hyacinth MeekerMiller who also saw  and evaluated the patient. Decision made to have patient admitted. Consult to internal medicine, patient admitted to service.   Final diagnoses:  Acute systolic congestive heart failure  Bilateral leg edema  Shortness of breath       Earle Gell Kirkland, PA-C 06/11/14 1610  Vida Roller, MD 06/11/14 [phone]
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
## 2014-06-10 NOTE — ED Notes (Signed)
PA Cartner in with patient at this time

## 2014-06-11 ENCOUNTER — Encounter (HOSPITAL_COMMUNITY): Payer: Self-pay | Admitting: Radiology

## 2014-06-11 ENCOUNTER — Emergency Department (HOSPITAL_COMMUNITY): Payer: Medicare Other

## 2014-06-11 ENCOUNTER — Inpatient Hospital Stay (HOSPITAL_COMMUNITY): Payer: Medicare Other

## 2014-06-11 DIAGNOSIS — I5023 Acute on chronic systolic (congestive) heart failure: Secondary | ICD-10-CM | POA: Diagnosis present

## 2014-06-11 DIAGNOSIS — Z720 Tobacco use: Secondary | ICD-10-CM

## 2014-06-11 DIAGNOSIS — I509 Heart failure, unspecified: Secondary | ICD-10-CM

## 2014-06-11 DIAGNOSIS — E0500 Thyrotoxicosis with diffuse goiter without thyrotoxic crisis or storm: Secondary | ICD-10-CM | POA: Diagnosis present

## 2014-06-11 DIAGNOSIS — R0602 Shortness of breath: Secondary | ICD-10-CM | POA: Diagnosis present

## 2014-06-11 DIAGNOSIS — R600 Localized edema: Secondary | ICD-10-CM | POA: Diagnosis present

## 2014-06-11 DIAGNOSIS — G8929 Other chronic pain: Secondary | ICD-10-CM | POA: Diagnosis present

## 2014-06-11 DIAGNOSIS — F17210 Nicotine dependence, cigarettes, uncomplicated: Secondary | ICD-10-CM | POA: Diagnosis present

## 2014-06-11 DIAGNOSIS — R109 Unspecified abdominal pain: Secondary | ICD-10-CM | POA: Diagnosis present

## 2014-06-11 DIAGNOSIS — R748 Abnormal levels of other serum enzymes: Secondary | ICD-10-CM | POA: Diagnosis present

## 2014-06-11 DIAGNOSIS — Z880 Allergy status to penicillin: Secondary | ICD-10-CM | POA: Diagnosis not present

## 2014-06-11 DIAGNOSIS — R1084 Generalized abdominal pain: Secondary | ICD-10-CM

## 2014-06-11 DIAGNOSIS — Z7982 Long term (current) use of aspirin: Secondary | ICD-10-CM | POA: Diagnosis not present

## 2014-06-11 DIAGNOSIS — I5030 Unspecified diastolic (congestive) heart failure: Secondary | ICD-10-CM | POA: Insufficient documentation

## 2014-06-11 LAB — MAGNESIUM: MAGNESIUM: 1.8 mg/dL (ref 1.5–2.5)

## 2014-06-11 LAB — PROTIME-INR
INR: 1.15 (ref 0.00–1.49)
Prothrombin Time: 14.8 seconds (ref 11.6–15.2)

## 2014-06-11 LAB — TROPONIN I
TROPONIN I: 0.04 ng/mL — AB (ref <0.031)
TROPONIN I: 0.03 ng/mL (ref <0.031)
Troponin I: 0.04 ng/mL — ABNORMAL HIGH (ref <0.031)

## 2014-06-11 LAB — LIPASE, BLOOD: Lipase: 20 U/L (ref 11–59)

## 2014-06-11 LAB — TSH: TSH: 0.011 u[IU]/mL — AB (ref 0.350–4.500)

## 2014-06-11 LAB — HEPATITIS PANEL, ACUTE
HCV Ab: NEGATIVE
Hep A IgM: NONREACTIVE
Hep B C IgM: NONREACTIVE
Hepatitis B Surface Ag: NEGATIVE

## 2014-06-11 MED ORDER — LISINOPRIL 2.5 MG PO TABS
2.5000 mg | ORAL_TABLET | Freq: Every day | ORAL | Status: DC
  Filled 2014-06-11: qty 1

## 2014-06-11 MED ORDER — FUROSEMIDE 10 MG/ML IJ SOLN
40.0000 mg | Freq: Every day | INTRAMUSCULAR | Status: DC
  Filled 2014-06-11: qty 4

## 2014-06-11 MED ORDER — SODIUM CHLORIDE 0.9 % IJ SOLN: 3.0000 mL | INTRAMUSCULAR | Status: DC | PRN

## 2014-06-11 MED ORDER — ACETAMINOPHEN 325 MG PO TABS
650.0000 mg | ORAL_TABLET | ORAL | Status: DC | PRN
Start: 2014-06-11 — End: 2014-06-12
  Administered 2014-06-11: 650 mg via ORAL
  Filled 2014-06-11: qty 2

## 2014-06-11 MED ORDER — HEPARIN SODIUM (PORCINE) 5000 UNIT/ML IJ SOLN
5000.0000 [IU] | Freq: Three times a day (TID) | INTRAMUSCULAR | Status: DC
  Administered 2014-06-11 – 2014-06-12 (×3): 5000 [IU] via SUBCUTANEOUS
  Filled 2014-06-11 (×7): qty 1

## 2014-06-11 MED ORDER — NICOTINE 21 MG/24HR TD PT24
21.0000 mg | MEDICATED_PATCH | Freq: Every day | TRANSDERMAL | Status: DC
  Filled 2014-06-11 (×2): qty 1

## 2014-06-11 MED ORDER — IOHEXOL 350 MG/ML SOLN
100.0000 mL | Freq: Once | INTRAVENOUS | Status: AC | PRN
  Administered 2014-06-11: 100 mL via INTRAVENOUS

## 2014-06-11 MED ORDER — SODIUM CHLORIDE 0.9 % IV SOLN: 250.0000 mL | INTRAVENOUS | Status: DC | PRN

## 2014-06-11 MED ORDER — FUROSEMIDE 10 MG/ML IJ SOLN
40.0000 mg | Freq: Two times a day (BID) | INTRAMUSCULAR | Status: DC
  Administered 2014-06-11: 40 mg via INTRAVENOUS
  Filled 2014-06-11 (×2): qty 4

## 2014-06-11 MED ORDER — ENSURE COMPLETE PO LIQD
237.0000 mL | Freq: Two times a day (BID) | ORAL | Status: DC
  Administered 2014-06-11 (×2): 237 mL via ORAL

## 2014-06-11 MED ORDER — METOPROLOL TARTRATE 50 MG PO TABS
50.0000 mg | ORAL_TABLET | Freq: Two times a day (BID) | ORAL | Status: DC
  Administered 2014-06-11 (×2): 50 mg via ORAL
  Filled 2014-06-11 (×4): qty 1

## 2014-06-11 MED ORDER — PNEUMOCOCCAL VAC POLYVALENT 25 MCG/0.5ML IJ INJ
0.5000 mL | INJECTION | INTRAMUSCULAR | Status: DC
  Filled 2014-06-11: qty 0.5

## 2014-06-11 MED ORDER — ASPIRIN EC 81 MG PO TBEC
81.0000 mg | DELAYED_RELEASE_TABLET | Freq: Every day | ORAL | Status: DC
  Administered 2014-06-11: 81 mg via ORAL
  Filled 2014-06-11 (×2): qty 1

## 2014-06-11 MED ORDER — ENSURE COMPLETE PO LIQD
237.0000 mL | Freq: Three times a day (TID) | ORAL | Status: DC
  Administered 2014-06-11: 237 mL via ORAL

## 2014-06-11 MED ORDER — LISINOPRIL 5 MG PO TABS
5.0000 mg | ORAL_TABLET | Freq: Every day | ORAL | Status: DC
  Filled 2014-06-11: qty 1

## 2014-06-11 MED ORDER — METHIMAZOLE 10 MG PO TABS
20.0000 mg | ORAL_TABLET | Freq: Two times a day (BID) | ORAL | Status: DC
  Administered 2014-06-11 (×2): 20 mg via ORAL
  Filled 2014-06-11 (×4): qty 2

## 2014-06-11 MED ORDER — ONDANSETRON HCL 4 MG/2ML IJ SOLN
4.0000 mg | Freq: Four times a day (QID) | INTRAMUSCULAR | Status: DC | PRN
  Administered 2014-06-11: 4 mg via INTRAVENOUS
  Filled 2014-06-11: qty 2

## 2014-06-11 MED ORDER — SODIUM CHLORIDE 0.9 % IJ SOLN
3.0000 mL | Freq: Two times a day (BID) | INTRAMUSCULAR | Status: DC
  Administered 2014-06-11 (×3): 3 mL via INTRAVENOUS

## 2014-06-11 NOTE — Care Management Note (Signed)
    Page 1 of 1   06/12/2014     3:53:46 PM CARE MANAGEMENT NOTE 06/12/2014  Patient:  DOMINIC, RHOME   Account Number:  [id number]  Date Initiated:  06/11/2014  Documentation initiated by:  AMERSON,JULIE  Subjective/Objective Assessment:   Pt adm on 06/10/14 with CHF, goiter.  PTA, pt resides at home with spouse.     Action/Plan:   Will follow for dc needs as pt progresses.   Anticipated DC Date:  06/14/2014   Anticipated DC Plan:  [place]  CM consult      Choice offered to / List presented to:             Status of service:  Completed, signed off Medicare Important Message given?   (If response is "NO", the following Medicare IM given date fields will be blank) Date Medicare IM given:   Medicare IM given by:   Date Additional Medicare IM given:   Additional Medicare IM given by:    Discharge Disposition:  HOME/SELF CARE  Per UR Regulation:  Reviewed for med. necessity/level of care/duration of stay  If discussed at [place] of Stay Meetings, dates discussed:    Comments:  06/12/14 15:00 Cm met with RN to see if pt had any HH needs. RN states no HH needs.  No other CM needs were communicated.  Mariane Masters, BSn, CM [phone].

## 2014-06-11 NOTE — ED Notes (Signed)
Pt refusing to wear hospital socks

## 2014-06-11 NOTE — Progress Notes (Signed)
Patient Demographics  Julie Charles, is a 79 y.o. female, DOB - 1936-01-19, ZOX:[id number]  Admit date - 06/10/2014   Admitting Physician Lorretta Harp, MD  Outpatient Primary MD for the patient is No primary care provider on file.  LOS - 0   Chief Complaint  Patient presents with  . Skin Problem  . Altered Mental Status        Subjective:   Julie Charles today has, No headache, No chest pain, No abdominal pain - No Nausea, No new weakness tingling or numbness, No Cough - Improved SOB.    Assessment & Plan    1. Acute on Chr CHF nonspecific, no Echo in the system - Echo pending, continue Lasix IV, added Lopressor, continue salt and fluid restriction, supportive care and monitor.   2. Goiter with hyperthyroidism. Discussed with endocrinologist Dr. Everardo All, placed on methimazole, will also add beta blocker, outpatient follow-up with Dr. Everardo All within a week.   3. Tobacco abuse. Nicotine patch. Counseled.   4. Mildly elevated liver enzymes. Likely due to hepatic condition from CHF, abdominal ultrasound stable, appetite is panel stable. We'll monitor trend. Symptom-free.     Code Status: Full  Family Communication: None  Disposition Plan: Home   Procedures CTA chest, TTE   Consults   Endocrinologist Dr Dorthula Rue over the phone    Medications  Scheduled Meds: . aspirin EC  81 mg Oral Daily  . feeding supplement (ENSURE COMPLETE)  237 mL Oral BID BM  . furosemide  40 mg Intravenous Daily  . heparin  5,000 Units Subcutaneous 3 times per day  . lisinopril  5 mg Oral Daily  . methimazole  20 mg Oral BID  . nicotine  21 mg Transdermal Daily  . [START ON 06/12/2014] pneumococcal 23 valent vaccine  0.5 mL Intramuscular Tomorrow-1000  . sodium chloride  3 mL Intravenous Q12H    Continuous Infusions:  PRN Meds:.sodium chloride, acetaminophen, ondansetron (ZOFRAN) IV, sodium chloride  DVT Prophylaxis    Heparin   Lab Results  Component Value Date   PLT 186 06/10/2014    Antibiotics     Anti-infectives    None          Objective:   Filed Vitals:   06/10/14 2300 06/11/14 0030 06/11/14 0155 06/11/14 0203  BP: 147/82 156/77 144/97   Pulse: 108 107 113   Temp:   97.5 F (36.4 C)   TempSrc:   Oral   Resp: Height:     (1.676 m)  Weight:    55.248 kg (121 lb 12.8 oz)  SpO2: 98% 95% 92%     Wt Readings from Last 3 Encounters:  06/11/14 55.248 kg (121 lb 12.8 oz)     Intake/Output Summary (Last 24 hours) at 06/11/14 1109 Last data filed at 06/11/14 0829  Gross per 24 hour  Intake    120 ml  Output    850 ml  Net   -730 ml     Physical Exam  Awake Alert, Oriented X 3, No new F.N deficits, Normal affect [place].AT,PERRAL Supple Neck,No JVD, No cervical lymphadenopathy appriciated.  Symmetrical Chest wall movement, Good air movement bilaterally, +ve Rales RRR,No Gallops,Rubs or new Murmurs, No Parasternal Heave +  ve B.Sounds, Abd Soft, No tenderness, No organomegaly appriciated, No rebound - guarding or rigidity. No Cyanosis, Clubbing , 1+ edema, No new Rash or bruise      Data Review   Micro Results No results found for this or any previous visit (from the past 240 hour(s)).  Radiology Reports Dg Chest 2 View  06/10/2014   CLINICAL DATA:  Shortness of breath and cough. Redness in the lower right leg.  EXAM: CHEST  2 VIEW  COMPARISON:  None.  FINDINGS: Cardiac enlargement with mild pulmonary vascular congestion. Diffuse interstitial changes in the lungs likely representing edema. Increased opacity in the right lung base may represent superimposed pneumonia or asymmetric edema. Multifocal pneumonia not excluded. No blunting of costophrenic angles. No pneumothorax. Calcified and tortuous aorta appear  IMPRESSION: Cardiac  enlargement with pulmonary vascular congestion and interstitial infiltrates likely due to edema. Asymmetric edema versus superimposed infiltration in the right lung base.   Electronically Signed   By: Burman NievesWilliam  Stevens M.D.   On: 06/10/2014 21:58   Ct Angio Chest Pe W/cm &/or Wo Cm  06/11/2014   CLINICAL DATA:  Shortness of breath for 1-2 weeks.  EXAM: CT ANGIOGRAPHY CHEST WITH CONTRAST  TECHNIQUE: Multidetector CT imaging of the chest was performed using the standard protocol during bolus administration of intravenous contrast. Multiplanar CT image reconstructions and MIPs were obtained to evaluate the vascular anatomy.  CONTRAST:  100mL OMNIPAQUE IOHEXOL 350 MG/ML SOLN  COMPARISON:  Chest radiograph 06/10/2014  FINDINGS: There are no filling defects within the pulmonary arteries to suggest pulmonary embolus.  Atherosclerosis of the thoracic arch without aneurysm. There is multi chamber cardiomegaly. Contrast refluxes into the IVC and hepatic veins. Scattered coronary artery calcifications. There are bilateral pleural effusions, moderate on the right and small to moderate on the left. No pericardial effusion.  Suspect background diffuse ground-glass opacities throughout the lungs likely representing pulmonary edema. Mild peripheral septal thickening. There is consolidation in the right middle lobe with air bronchograms.  Heterogeneous enlargement of the right thyroid gland. No acute abnormality in the included upper abdomen. There are no acute or suspicious osseous abnormalities.  Review of the MIP images confirms the above findings.  IMPRESSION: 1. No pulmonary embolus. 2. Congestive heart failure. 3. Right middle lobe consolidation with air bronchograms. This may reflect atelectasis, however superimposed pneumonia is not excluded. 4. Heterogeneous enlargement of the right thyroid gland. In a patient of this age this is most consistent with goiter.   Electronically Signed   By: Rubye OaksMelanie  Ehinger M.D.   On:  06/11/2014 01:11   [place] Abdomen Complete  06/11/2014   CLINICAL DATA:  Acute onset abdominal pain for 1 day.  EXAM: ULTRASOUND ABDOMEN COMPLETE  COMPARISON:  None.  FINDINGS: Gallbladder: No gallstones or wall thickening visualized. No sonographic Murphy sign noted. Mild ring down artifact noted from the nondependent gallbladder wall, consistent with adenomyomatosis, which is of no clinical significance .  Common bile duct: Diameter: 2 mm, within normal limits.  Liver: No focal lesion identified. Within normal limits in parenchymal echogenicity.  IVC: No abnormality visualized.  Pancreas: Visualized portion unremarkable.  Spleen: Size and appearance within normal limits.  Right Kidney: Length: 9.2 cm. Echogenicity within normal limits. No mass or hydronephrosis visualized.  Left Kidney: Present but not well visualized due to overlying bowel gas and lack of acoustic window.  Abdominal aorta: No aneurysm visualized.  Other findings: Bilateral pleural effusions, right side greater than left.  IMPRESSION: No evidence of gallstones, biliary ductal  dilatation, or other significant abnormality within the abdomen. Note that left kidney was not well visualized on this exam.  Bilateral pleural effusions, right side greater than left.   Electronically Signed   By: Myles Rosenthal M.D.   On: 06/11/2014 10:33     CBC  Recent Labs Lab 06/10/14 2209  WBC 4.2  HGB 13.6  HCT 41.8  PLT 186  MCV 87.1  MCH 28.3  MCHC 32.5  RDW 16.0*  LYMPHSABS 1.9  MONOABS 0.3  EOSABS 0.1  BASOSABS 0.0    Chemistries   Recent Labs Lab 06/10/14 2209 06/11/14 0800  NA 143  --   K 3.6  --   CL 112  --   CO2 17*  --   GLUCOSE 130*  --   BUN 24*  --   CREATININE 0.79  --   CALCIUM 9.3  --   MG  --  1.8  AST 44*  --   ALT 58*  --   ALKPHOS 130*  --   BILITOT 1.1  --    ------------------------------------------------------------------------------------------------------------------ estimated creatinine clearance is  52.1 mL/min (by C-G formula based on Cr of 0.79). ------------------------------------------------------------------------------------------------------------------ No results for input(s): HGBA1C in the last 72 hours. ------------------------------------------------------------------------------------------------------------------ No results for input(s): CHOL, HDL, LDLCALC, TRIG, CHOLHDL, LDLDIRECT in the last 72 hours. ------------------------------------------------------------------------------------------------------------------  Recent Labs  06/11/14 0357  TSH 0.011*   ------------------------------------------------------------------------------------------------------------------ No results for input(s): VITAMINB12, FOLATE, FERRITIN, TIBC, IRON, RETICCTPCT in the last 72 hours.  Coagulation profile  Recent Labs Lab 06/11/14 0337  INR 1.15    No results for input(s): DDIMER in the last 72 hours.  Cardiac Enzymes  Recent Labs Lab 06/11/14 0337 06/11/14 0800  TROPONINI 0.04* 0.03   ------------------------------------------------------------------------------------------------------------------ Invalid input(s): POCBNP     Time Spent in minutes   35   Pilot Prindle K M.D on 06/11/2014 at 11:09 AM  Between 7am to 7pm - Pager - [phone]  After 7pm go to [email or web address] - password [facility]  [facility]   Office  [phone]

## 2014-06-11 NOTE — Progress Notes (Signed)
Echocardiogram 2D Echocardiogram has been performed.  Julie BasemanReel, Julie Charles 06/11/2014, 1:54 PM

## 2014-06-11 NOTE — Progress Notes (Addendum)
INITIAL NUTRITION ASSESSMENT  DOCUMENTATION CODES Per approved criteria  -Severe Malnutrition in the context of Chronic Illness  Pt meets nutrition criteria for SEVERE MALNUTRITION in the context of CHRONIC ILLNESS as evidenced by 17% weight loss in less than 6 months and estimated energy intake <50% of estimated energy needs for > 1 month.  INTERVENTION: Continue Ensure Enlive TID in between meals, each supplement provides 350 kcal and 20 grams of protein RD to continue to monitor for nutrition needs  NUTRITION DIAGNOSIS: Predicted sub optimal energy intake related to decreased appetite as evidenced by pt's report in chart and 45% meal completion.  Goal: Pt to meet >/= 90% of their estimated nutrition needs   Monitor:  PO intake, weight trend, labs  Reason for Assessment: Malnutrition Screening Tool, score of 3  79 y.o. female  Admitting Dx: CHF (congestive heart failure)  ASSESSMENT: 79 y.o. female without significant past medical history, only taking aspirin and Tylenol occasionally at home, who presents with shortness of breath, leg edema, abdominal pain.  Pt reports that she used to maintain her weight at 145 lbs but, 6 months ago she started to have a poor appetite causing weight loss. She reports eating 50% less than usual for the past 6 months. She reports on and off upset stomach and stress contributing to decreased appetite. Based on patient's report she has lost 24 lbs in the past 6 months- 17% weight loss.  Per nursing notes, pt consumed 45% of lunch today and was NPO for breakfast.  Pt agreeable to receiving Ensure supplements.  Labs reviewed.   Nutrition Focused Physical Exam:  Subcutaneous Fat:  Orbital Region: mild wasting Upper Arm Region: mild wasting Thoracic and Lumbar Region: moderate wasting  Muscle:  Temple Region: moderate wasting Clavicle Bone Region: moderate wasting Clavicle and Acromion Bone Region: moderate wasting Scapular Bone Region: mild  wasting Dorsal Hand: moderate wasting Patellar Region: mild wasting Anterior Thigh Region: mild wasting Posterior Calf Region: mild wasting  Edema: none noted   Height: Ht Readings from Last 1 Encounters:  06/11/14  (1.676 m)    Weight: Wt Readings from Last 1 Encounters:  06/11/14 121 lb 12.8 oz (55.248 kg)    Ideal Body Weight: 130 lbs  % Ideal Body Weight: 93%  Wt Readings from Last 10 Encounters:  06/11/14 121 lb 12.8 oz (55.248 kg)    Usual Body Weight: unknown  % Usual Body Weight: NA  BMI:  Body mass index is 19.67 kg/(m^2).  Estimated Nutritional Needs: Kcal: 1500-1700 Protein: 65-75 grams  Fluid: 1.5-1.7 L/day  Skin: intact  Diet Order:    EDUCATION NEEDS: -No education needs identified at this time   Intake/Output Summary (Last 24 hours) at 06/11/14 1428 Last data filed at 06/11/14 1345  Gross per 24 hour  Intake    240 ml  Output    850 ml  Net   -610 ml    Last BM: 2/25   Labs:   Recent Labs Lab 06/10/14 2209 06/11/14 0800  NA 143  --   K 3.6  --   CL 112  --   CO2 17*  --   BUN 24*  --   CREATININE 0.79  --   CALCIUM 9.3  --   MG  --  1.8  GLUCOSE 130*  --     CBG (last 3)   Recent Labs  06/10/14 2136  GLUCAP 111*    Scheduled Meds: . aspirin EC  81 mg Oral Daily  .  feeding supplement (ENSURE COMPLETE)  237 mL Oral BID BM  . furosemide  40 mg Intravenous BID  . heparin  5,000 Units Subcutaneous 3 times per day  . [START ON 06/12/2014] lisinopril  2.5 mg Oral Daily  . methimazole  20 mg Oral BID  . metoprolol tartrate  50 mg Oral BID  . nicotine  21 mg Transdermal Daily  . [START ON 06/12/2014] pneumococcal 23 valent vaccine  0.5 mL Intramuscular Tomorrow-1000  . sodium chloride  3 mL Intravenous Q12H    Continuous Infusions:   History reviewed. No pertinent past medical history.  Past Surgical History  Procedure Laterality Date  . Inner ear surgery      Ian Malkineanne Barnett RD, LDN Inpatient Clinical  Dietitian Pager: [phone] After Hours Pager: [phone]

## 2014-06-11 NOTE — H&P (Addendum)
[facility] History and Physical  Julie Charles ZOX:[id number] DOB: 1936-01-31 DOA: 06/10/2014  Referring physician: ED physician PCP: No primary care provider on file.  Specialists:   Chief Complaint: shortness of breath, leg edema, abdominal pain  HPI: Julie Charles is a 79 y.o. female without significant past medical history, only taking aspirin and Tylenol occasionally at home, who presents with shortness of breath, leg edema, abdominal pain.  Patient reports that in the last week, she has progressively worsening shortness of breath and leg edema. She has cough with clear mucus production. No chest pain, fever or chills.  Patient also reports having chronic abdominal pain, which has been going on for a long time. It is intermittent, moderate. It is aggravated by laying down. no nausea, vomiting, diarrhea. She did not seek for treatment. Patient does not have symptoms of UTI.  Patient denies fever, chills, diarrhea, dysuria, urgency, frequency, hematuria, skin rashes. No unilateral weakness, numbness or tingling sensations. No vision change or hearing loss.  In ED, patient was found to have Elevated BNP 2126, pulmonary edema on chest x-ray, negative CTA for pulmonary embolism, negative lipase, troponin 0.04, negative urinalysis, no leukocytosis, temperature normal.  Review of Systems: As presented in the history of presenting illness, rest negative.  Where does patient live? at home   Can patient participate in ADLs? some  Allergy:  Allergies  Allergen Reactions  . Penicillins     Unknown reaction    History reviewed. No pertinent past medical history.  Past Surgical History  Procedure Laterality Date  . Inner ear surgery      Social History:  reports that she has been smoking Cigarettes.  She has been smoking about 0.50 packs per day. She does not have any smokeless tobacco history on file. She reports that she does not drink alcohol. Her drug history is not on  file.  Family History: patient reports that her mother, father, brother and sisters are all healthy.  Prior to Admission medications   Not on File    Physical Exam: Filed Vitals:   06/10/14 2300 06/11/14 0030 06/11/14 0155 06/11/14 0203  BP: 147/82 156/77 144/97   Pulse: 108 107 113   Temp:   97.5 F (36.4 C)   TempSrc:   Oral   Resp: Height:     (1.676 m)  Weight:    55.248 kg (121 lb 12.8 oz)  SpO2: 98% 95% 92%    General: Not in acute distress, has cyanosis HEENT:       Eyes: PERRL, EOMI, no scleral icterus       ENT: No discharge from the ears and nose, no pharynx injection, no tonsillar enlargement.        Neck: positive JVD, no bruit, no mass felt. Cardiac: S1/S2, RRR, No murmurs, No gallops or rubs Pulm: decreased air movement bilaterally.  No rales, wheezing, rhonchi or rubs. Abd: Soft, nondistended, diffuse and mild tenderness, no rebound pain, no organomegaly, BS present Ext: 2+ pitting leg edema bilaterally. 2+DP/PT pulse bilaterally Musculoskeletal: No joint deformities, erythema, or stiffness, ROM full Skin: No rashes.  Neuro: Alert and oriented X3, cranial nerves II-XII grossly intact, muscle strength 5/5 in all extremeties, sensation to light touch intact. Brachial reflex 2+ bilaterally. Knee reflex 1+ bilaterally. Negative Babinski's sign. Normal finger to nose test. Psych: Patient is not psychotic, no suicidal or hemocidal ideation.  Labs on Admission:  Basic Metabolic Panel:  Recent Labs Lab 06/10/14 2209  NA 143  K 3.6  CL 112  CO2 17*  GLUCOSE 130*  BUN 24*  CREATININE 0.79  CALCIUM 9.3   Liver Function Tests:  Recent Labs Lab 06/10/14 2209  AST 44*  ALT 58*  ALKPHOS 130*  BILITOT 1.1  PROT 5.9*  ALBUMIN 3.1*    Recent Labs Lab 06/11/14 0357  LIPASE 20   No results for input(s): AMMONIA in the last 168 hours. CBC:  Recent Labs Lab 06/10/14 2209  WBC 4.2  NEUTROABS 1.8  HGB 13.6  HCT 41.8  MCV 87.1  PLT  186   Cardiac Enzymes:  Recent Labs Lab 06/11/14 0337  TROPONINI 0.04*    BNP (last 3 results)  Recent Labs  06/10/14 2209  BNP 2126.1*    ProBNP (last 3 results) No results for input(s): PROBNP in the last 8760 hours.  CBG:  Recent Labs Lab 06/10/14 2136  GLUCAP 111*    Radiological Exams on Admission: Dg Chest 2 View  06/10/2014   CLINICAL DATA:  Shortness of breath and cough. Redness in the lower right leg.  EXAM: CHEST  2 VIEW  COMPARISON:  None.  FINDINGS: Cardiac enlargement with mild pulmonary vascular congestion. Diffuse interstitial changes in the lungs likely representing edema. Increased opacity in the right lung base may represent superimposed pneumonia or asymmetric edema. Multifocal pneumonia not excluded. No blunting of costophrenic angles. No pneumothorax. Calcified and tortuous aorta appear  IMPRESSION: Cardiac enlargement with pulmonary vascular congestion and interstitial infiltrates likely due to edema. Asymmetric edema versus superimposed infiltration in the right lung base.   Electronically Signed   By: Burman Nieves M.D.   On: 06/10/2014 21:58   Ct Angio Chest Pe W/cm &/or Wo Cm  06/11/2014   CLINICAL DATA:  Shortness of breath for 1-2 weeks.  EXAM: CT ANGIOGRAPHY CHEST WITH CONTRAST  TECHNIQUE: Multidetector CT imaging of the chest was performed using the standard protocol during bolus administration of intravenous contrast. Multiplanar CT image reconstructions and MIPs were obtained to evaluate the vascular anatomy.  CONTRAST:  OMNIPAQUE IOHEXOL 350 MG/ML SOLN  COMPARISON:  Chest radiograph 06/10/2014  FINDINGS: There are no filling defects within the pulmonary arteries to suggest pulmonary embolus.  Atherosclerosis of the thoracic arch without aneurysm. There is multi chamber cardiomegaly. Contrast refluxes into the IVC and hepatic veins. Scattered coronary artery calcifications. There are bilateral pleural effusions, moderate on the right and  small to moderate on the left. No pericardial effusion.  Suspect background diffuse ground-glass opacities throughout the lungs likely representing pulmonary edema. Mild peripheral septal thickening. There is consolidation in the right middle lobe with air bronchograms.  Heterogeneous enlargement of the right thyroid gland. No acute abnormality in the included upper abdomen. There are no acute or suspicious osseous abnormalities.  Review of the MIP images confirms the above findings.  IMPRESSION: 1. No pulmonary embolus. 2. Congestive heart failure. 3. Right middle lobe consolidation with air bronchograms. This may reflect atelectasis, however superimposed pneumonia is not excluded. 4. Heterogeneous enlargement of the right thyroid gland. In a patient of this age this is most consistent with goiter.   Electronically Signed   By: Rubye Oaks M.D.   On: 06/11/2014 01:11    EKG: Independently reviewed. LVH, LAE, LAD  Assessment/Plan Principal Problem:   CHF (congestive heart failure) Active Problems:   Bilateral leg edema   Shortness of breath   Abdominal pain   Tobacco abuse  Congestive heart failure: Patient's leg edema, shortness of breath and elevated BNP plus  pulmonary edema on chest x-ray are consistent with acute congestive heart failure. CTA is negative for pulmonary embolism. -will admit to Telemetry bed. -will treat with IV lasix 40 mg qdaily -start ASA and low dose of lisinopril 5 mg daily -will cycle troponin X3 -will get 2-D echo to evaluate EF -strict In/Out -Daily body weight.  Tobacco abuse: -nicotine patch -Consulted patient about the importance of quitting smoking  Abdominal pain: Etiology is not clear. Transaminases slightly elevated with AST 44, ALT 58, ALP 130. -check hepatitis panel, lipase, -Abdominal ultrasound (patient just had CT angiogram with contrast, will not be good to do CT-abd with contrast).  DVT ppx: SQ Heparin    Code Status: Full code Family  Communication:  Yes, patient's   son    at bed side Disposition Plan: Admit to inpatient   Date of Service 06/11/2014    Lorretta HarpIU, Mertice Uffelman [facility] Pager [phone]  If 7PM-7AM, please contact night-coverage [email or web address] Password [email or web address] 06/11/2014, 5:51 AM

## 2014-06-12 LAB — COMPREHENSIVE METABOLIC PANEL
ALT: 54 U/L — ABNORMAL HIGH (ref 0–35)
AST: 39 U/L — AB (ref 0–37)
Albumin: 3 g/dL — ABNORMAL LOW (ref 3.5–5.2)
Alkaline Phosphatase: 117 U/L (ref 39–117)
Anion gap: 11 (ref 5–15)
BUN: 24 mg/dL — ABNORMAL HIGH (ref 6–23)
CALCIUM: 9.4 mg/dL (ref 8.4–10.5)
CO2: 27 mmol/L (ref 19–32)
Chloride: 104 mmol/L (ref 96–112)
Creatinine, Ser: 0.89 mg/dL (ref 0.50–1.10)
GFR calc Af Amer: 71 mL/min — ABNORMAL LOW (ref >90)
GFR, EST NON AFRICAN AMERICAN: 61 mL/min — AB (ref >90)
Glucose, Bld: 96 mg/dL (ref 70–99)
Potassium: 3.6 mmol/L (ref 3.5–5.1)
SODIUM: 142 mmol/L (ref 135–145)
TOTAL PROTEIN: 5.7 g/dL — AB (ref 6.0–8.3)
Total Bilirubin: 0.8 mg/dL (ref 0.3–1.2)

## 2014-06-12 MED ORDER — FUROSEMIDE 40 MG PO TABS: 40.0000 mg | ORAL_TABLET | Freq: Every day | ORAL | Status: DC

## 2014-06-12 MED ORDER — POTASSIUM CHLORIDE ER 20 MEQ PO TBCR: 20.0000 meq | EXTENDED_RELEASE_TABLET | Freq: Every day | ORAL | Status: DC

## 2014-06-12 MED ORDER — ASPIRIN 81 MG PO TBEC: 81.0000 mg | DELAYED_RELEASE_TABLET | Freq: Every day | ORAL | Status: AC

## 2014-06-12 MED ORDER — METHIMAZOLE 10 MG PO TABS: 20.0000 mg | ORAL_TABLET | Freq: Two times a day (BID) | ORAL | Status: DC

## 2014-06-12 MED ORDER — NICOTINE 21 MG/24HR TD PT24: 21.0000 mg | MEDICATED_PATCH | Freq: Every day | TRANSDERMAL | Status: DC

## 2014-06-12 MED ORDER — LISINOPRIL 5 MG PO TABS: 2.5000 mg | ORAL_TABLET | Freq: Every day | ORAL | Status: DC

## 2014-06-12 MED ORDER — METOPROLOL TARTRATE 50 MG PO TABS: 50.0000 mg | ORAL_TABLET | Freq: Two times a day (BID) | ORAL | Status: DC

## 2014-06-12 MED ORDER — ENSURE COMPLETE PO LIQD: 237.0000 mL | Freq: Three times a day (TID) | ORAL | Status: DC

## 2014-06-12 NOTE — Discharge Summary (Signed)
Julie Charles, is a 79 y.o. female  DOB 02/16/1936  MRN [id number].  Admission date:  06/10/2014  Admitting Physician  Lorretta HarpXilin Niu, MD  Discharge Date:  06/12/2014   Primary MD  No primary care provider on file.  Recommendations for primary care physician for things to follow:   Monitor TSH, free T3, T4, CMP weight and diuretic dose closely.  He requires a repeat echogram in a month. Requires close cardiology and endocrinology follow-up.   Admission Diagnosis  Shortness of breath [R06.02] Bilateral leg edema [R60.0] Acute systolic congestive heart failure [I50.21]   Discharge Diagnosis  Shortness of breath [R06.02] Bilateral leg edema [R60.0] Acute systolic congestive heart failure [I50.21]     Principal Problem:   CHF (congestive heart failure) Active Problems:   Bilateral leg edema   Shortness of breath   Abdominal pain   Tobacco abuse      History reviewed. No pertinent past medical history.  Past Surgical History  Procedure Laterality Date  . Inner ear surgery         History of present illness and  Hospital Course:     Kindly see H&P for history of present illness and admission details, please review complete Labs, Consult reports and Test reports for all details in brief  HPI  from the history and physical done on the day of admission  Julie Charles is a 79 y.o. female without significant past medical history, only taking aspirin and Tylenol occasionally at home, who presents with shortness of breath, leg edema, abdominal pain.  Patient reports that in the last week, she has progressively worsening shortness of breath and leg edema. She has cough with clear mucus production. No chest pain, fever or chills.  Patient also reports having chronic abdominal pain, which has been going on for a  long time. It is intermittent, moderate. It is aggravated by laying down. no nausea, vomiting, diarrhea. She did not seek for treatment. Patient does not have symptoms of UTI. Patient denies fever, chills, diarrhea, dysuria, urgency, frequency, hematuria, skin rashes. No unilateral weakness, numbness or tingling sensations. No vision change or hearing loss.  In ED, patient was found to have Elevated BNP 2126, pulmonary edema on chest x-ray, negative CTA for pulmonary embolism, negative lipase, troponin 0.04, negative urinalysis, no leukocytosis, temperature normal.    Hospital Course    1. Acute on Chr Systolic CHF EF 25%  - improved after diuresis with IV Lasix, now symptom-free and no shortness of breath, her EF was severely depressed, this could be due to untreated hyperthyroidism with tachycardia, she has been now placed on oral Lasix, Lopressor, ACE inhibitor with written instructions on salt and fluid restriction, I offered her cardiology evaluation in the hospital which she refused, she wanted to be discharged right away today so that she can accompany her son back home. She has agreed to outpatient cardiology follow-up and follow-up with PCP. Will request PCP to monitor weight, BMP, diuretic dose along with TSH, free T3 and T4.   2. Goiter  with hyperthyroidism. Discussed with endocrinologist Dr. Everardo All, placed on methimazole, will also add beta blocker, outpatient follow-up with Dr. Everardo All within a week.   3. Tobacco abuse. Nicotine patch. Counseled.   4. Mildly elevated liver enzymes. Likely due to hepatic condition from CHF, abdominal ultrasound stable, appetite is panel stable. Trend improving request PCP to repeat CMP in 1-2 weeks.   Discharge Condition: Stable   Follow UP  Follow-up Information    Follow up with Romero Belling, MD. Schedule an appointment as soon as possible for a visit in 1 week.   Specialty:  Endocrinology   Why:  Hyperthyroidism   Contact information:     [address] [place] [address] [phone]       Follow up with [facility]    . Schedule an appointment as soon as possible for a visit in 1 week.   Why:  Hyperthyroidism   Contact information:   [address] [place] [address] [phone]      Follow up with Donato Schultz, MD. Schedule an appointment as soon as possible for a visit in 1 week.   Specialty:  Cardiology   Why:  CHF   Contact information:   [address] [place] [address] [phone]         Discharge Instructions  and  Discharge Medications          Discharge Instructions    Diet - low sodium heart healthy    Complete by:  As directed      Discharge instructions    Complete by:  As directed   Follow with Primary MD  in 7 days   Get CBC, CMP, 2 view Chest X ray checked  by Primary MD next visit.    Activity: As tolerated with Full fall precautions use walker/cane & assistance as needed   Disposition Home     Diet: Heart Healthy  Check your Weight same time everyday, if you gain over 2 pounds, or you develop in leg swelling, experience more shortness of breath or chest pain, call your Primary MD immediately. Follow Cardiac Low Salt Diet and 1.5 lit/day fluid restriction.   On your next visit with your primary care physician please Get Medicines reviewed and adjusted.   Please request your Prim.MD to go over all Hospital Tests and Procedure/Radiological results at the follow up, please get all Hospital records sent to your Prim MD by signing hospital release before you go home.   If you experience worsening of your admission symptoms, develop shortness of breath, life threatening emergency, suicidal or homicidal thoughts you must seek medical attention immediately by calling 911 or calling your MD immediately  if symptoms less severe.  You Must read complete instructions/literature along with all the  possible adverse reactions/side effects for all the Medicines you take and that have been prescribed to you. Take any new Medicines after you have completely understood and accpet all the possible adverse reactions/side effects.   Do not drive, operating heavy machinery, perform activities at heights, swimming or participation in water activities or provide baby sitting services if your were admitted for syncope or siezures until you have seen by Primary MD or a Neurologist and advised to do so again.  Do not drive when taking Pain medications.    Do not take more than prescribed Pain, Sleep and Anxiety Medications  Special Instructions: If you have smoked or chewed Tobacco  in  the last 2 yrs please stop smoking, stop any regular Alcohol  and or any Recreational drug use.  Wear Seat belts while driving.   Please note  You were cared for by a hospitalist during your hospital stay. If you have any questions about your discharge medications or the care you received while you were in the hospital after you are discharged, you can call the unit and asked to speak with the hospitalist on call if the hospitalist that took care of you is not available. Once you are discharged, your primary care physician will handle any further medical issues. Please note that NO REFILLS for any discharge medications will be authorized once you are discharged, as it is imperative that you return to your primary care physician (or establish a relationship with a primary care physician if you do not have one) for your aftercare needs so that they can reassess your need for medications and monitor your lab values.     Increase activity slowly    Complete by:  As directed             Medication List    TAKE these medications        aspirin 81 MG EC tablet  Take 1 tablet (81 mg total) by mouth daily.     feeding supplement (ENSURE COMPLETE) Liqd  Take 237 mLs by mouth 3 (three) times daily between meals.      furosemide 40 MG tablet  Commonly known as:  LASIX  Take 1 tablet (40 mg total) by mouth daily.     lisinopril 5 MG tablet  Commonly known as:  PRINIVIL,ZESTRIL  Take 0.5 tablets (2.5 mg total) by mouth daily.     methimazole 10 MG tablet  Commonly known as:  TAPAZOLE  Take 2 tablets (20 mg total) by mouth 2 (two) times daily.     metoprolol 50 MG tablet  Commonly known as:  LOPRESSOR  Take 1 tablet (50 mg total) by mouth 2 (two) times daily.     nicotine 21 mg/24hr patch  Commonly known as:  NICODERM CQ - dosed in mg/24 hours  Place 1 patch (21 mg total) onto the skin daily.     Potassium Chloride ER 20 MEQ Tbcr  Take 20 mEq by mouth daily.          Diet and Activity recommendation: See Discharge Instructions above   Consults obtained - Dr Lanna Poche over the phone   Major procedures and Radiology Reports - PLEASE review detailed and final reports for all details, in brief -       Dg Chest 2 View  06/10/2014   CLINICAL DATA:  Shortness of breath and cough. Redness in the lower right leg.  EXAM: CHEST  2 VIEW  COMPARISON:  None.  FINDINGS: Cardiac enlargement with mild pulmonary vascular congestion. Diffuse interstitial changes in the lungs likely representing edema. Increased opacity in the right lung base may represent superimposed pneumonia or asymmetric edema. Multifocal pneumonia not excluded. No blunting of costophrenic angles. No pneumothorax. Calcified and tortuous aorta appear  IMPRESSION: Cardiac enlargement with pulmonary vascular congestion and interstitial infiltrates likely due to edema. Asymmetric edema versus superimposed infiltration in the right lung base.   Electronically Signed   By: Burman Nieves M.D.   On: 06/10/2014 21:58   Ct Angio Chest Pe W/cm &/or Wo Cm  06/11/2014   CLINICAL DATA:  Shortness of breath for 1-2 weeks.  EXAM: CT ANGIOGRAPHY CHEST WITH CONTRAST  TECHNIQUE: Multidetector  CT imaging of the chest was performed using the standard protocol  during bolus administration of intravenous contrast. Multiplanar CT image reconstructions and MIPs were obtained to evaluate the vascular anatomy.  CONTRAST:  OMNIPAQUE IOHEXOL 350 MG/ML SOLN  COMPARISON:  Chest radiograph 06/10/2014  FINDINGS: There are no filling defects within the pulmonary arteries to suggest pulmonary embolus.  Atherosclerosis of the thoracic arch without aneurysm. There is multi chamber cardiomegaly. Contrast refluxes into the IVC and hepatic veins. Scattered coronary artery calcifications. There are bilateral pleural effusions, moderate on the right and small to moderate on the left. No pericardial effusion.  Suspect background diffuse ground-glass opacities throughout the lungs likely representing pulmonary edema. Mild peripheral septal thickening. There is consolidation in the right middle lobe with air bronchograms.  Heterogeneous enlargement of the right thyroid gland. No acute abnormality in the included upper abdomen. There are no acute or suspicious osseous abnormalities.  Review of the MIP images confirms the above findings.  IMPRESSION: 1. No pulmonary embolus. 2. Congestive heart failure. 3. Right middle lobe consolidation with air bronchograms. This may reflect atelectasis, however superimposed pneumonia is not excluded. 4. Heterogeneous enlargement of the right thyroid gland. In a patient of this age this is most consistent with goiter.   Electronically Signed   By: Rubye Oaks M.D.   On: 06/11/2014 01:11   US Abdomen Complete  06/11/2014   CLINICAL DATA:  Acute onset abdominal pain for 1 day.  EXAM: ULTRASOUND ABDOMEN COMPLETE  COMPARISON:  None.  FINDINGS: Gallbladder: No gallstones or wall thickening visualized. No sonographic Murphy sign noted. Mild ring down artifact noted from the nondependent gallbladder wall, consistent with adenomyomatosis, which is of no clinical significance .  Common bile duct: Diameter: 2 mm, within normal limits.  Liver: No focal lesion  identified. Within normal limits in parenchymal echogenicity.  IVC: No abnormality visualized.  Pancreas: Visualized portion unremarkable.  Spleen: Size and appearance within normal limits.  Right Kidney: Length: 9.2 cm. Echogenicity within normal limits. No mass or hydronephrosis visualized.  Left Kidney: Present but not well visualized due to overlying bowel gas and lack of acoustic window.  Abdominal aorta: No aneurysm visualized.  Other findings: Bilateral pleural effusions, right side greater than left.  IMPRESSION: No evidence of gallstones, biliary ductal dilatation, or other significant abnormality within the abdomen. Note that left kidney was not well visualized on this exam.  Bilateral pleural effusions, right side greater than left.   Electronically Signed   By: Myles Rosenthal M.D.   On: 06/11/2014 10:33    Micro Results      No results found for this or any previous visit (from the past 240 hour(s)).     Today   Subjective:   Julie Charles today has no headache,no chest abdominal pain,no new weakness tingling or numbness, feels much better wants to go home today.    Objective:   Blood pressure 138/72, pulse 95, temperature 97.9 F (36.6 C), temperature source Oral, resp. rate 20, height  (1.676 m), weight 53.887 kg (118 lb 12.8 oz), SpO2 100 %.   Intake/Output Summary (Last 24 hours) at 06/12/14 1005 Last data filed at 06/12/14 0510  Gross per 24 hour  Intake    597 ml  Output   1200 ml  Net   -603 ml    Exam Awake Alert, Oriented x 3, No new F.N deficits, Normal affect [place].AT,PERRAL Supple Neck,No JVD, No cervical lymphadenopathy appriciated.  Symmetrical Chest wall movement, Good air movement  bilaterally, CTAB RRR,No Gallops,Rubs or new Murmurs, No Parasternal Heave +ve B.Sounds, Abd Soft, Non tender, No organomegaly appriciated, No rebound -guarding or rigidity. No Cyanosis, Clubbing or edema, No new Rash or bruise  Data Review   CBC w Diff:  Lab Results    Component Value Date   WBC 4.2 06/10/2014   HGB 13.6 06/10/2014   HCT 41.8 06/10/2014   PLT 186 06/10/2014   LYMPHOPCT 46 06/10/2014   MONOPCT 8 06/10/2014   EOSPCT 3 06/10/2014   BASOPCT 1 06/10/2014    CMP:  Lab Results  Component Value Date   NA 142 06/12/2014   K 3.6 06/12/2014   CL 104 06/12/2014   CO2 27 06/12/2014   BUN 24* 06/12/2014   CREATININE 0.89 06/12/2014   PROT 5.7* 06/12/2014   ALBUMIN 3.0* 06/12/2014   BILITOT 0.8 06/12/2014   ALKPHOS 117 06/12/2014   AST 39* 06/12/2014   ALT 54* 06/12/2014  .   Total Time in preparing paper work, data evaluation and todays exam - 35 minutes  Leroy Sea M.D on 06/12/2014 at 10:05 AM  [facility]   Office  [phone]

## 2014-06-12 NOTE — Discharge Instructions (Signed)
Follow with Primary MD  in 7 days  ° °Get CBC, CMP, 2 view Chest X ray checked  by Primary MD next visit.  ° ° °Activity: As tolerated with Full fall precautions use walker/cane & assistance as needed ° ° °Disposition Home   ° ° °Diet: Heart Healthy - Check your Weight same time everyday, if you gain over 2 pounds, or you develop in leg swelling, experience more shortness of breath or chest pain, call your Primary MD immediately. Follow Cardiac Low Salt Diet and 1.5 lit/day fluid restriction. ° ° °On your next visit with your primary care physician please Get Medicines reviewed and adjusted. ° ° °Please request your Prim.MD to go over all Hospital Tests and Procedure/Radiological results at the follow up, please get all Hospital records sent to your Prim MD by signing hospital release before you go home. ° ° °If you experience worsening of your admission symptoms, develop shortness of breath, life threatening emergency, suicidal or homicidal thoughts you must seek medical attention immediately by calling 911 or calling your MD immediately  if symptoms less severe. ° °You Must read complete instructions/literature along with all the possible adverse reactions/side effects for all the Medicines you take and that have been prescribed to you. Take any new Medicines after you have completely understood and accpet all the possible adverse reactions/side effects.  ° °Do not drive, operating heavy machinery, perform activities at heights, swimming or participation in water activities or provide baby sitting services if your were admitted for syncope or siezures until you have seen by Primary MD or a Neurologist and advised to do so again. ° °Do not drive when taking Pain medications.  ° ° °Do not take more than prescribed Pain, Sleep and Anxiety Medications ° °Special Instructions: If you have smoked or chewed Tobacco  in the last 2 yrs please stop smoking, stop any regular Alcohol  and or any Recreational drug use. ° °Wear  Seat belts while driving. ° ° °Please note ° °You were cared for by a hospitalist during your hospital stay. If you have any questions about your discharge medications or the care you received while you were in the hospital after you are discharged, you can call the unit and asked to speak with the hospitalist on call if the hospitalist that took care of you is not available. Once you are discharged, your primary care physician will handle any further medical issues. Please note that NO REFILLS for any discharge medications will be authorized once you are discharged, as it is imperative that you return to your primary care physician (or establish a relationship with a primary care physician if you do not have one) for your aftercare needs so that they can reassess your need for medications and monitor your lab values. ° °

## 2015-01-10 ENCOUNTER — Emergency Department (HOSPITAL_COMMUNITY): Payer: Medicare Other

## 2015-01-10 ENCOUNTER — Encounter (HOSPITAL_COMMUNITY): Payer: Self-pay | Admitting: Emergency Medicine

## 2015-01-10 ENCOUNTER — Inpatient Hospital Stay (HOSPITAL_COMMUNITY)
Admission: EM | Admit: 2015-01-10 | Discharge: 2015-01-15 | DRG: 292 | Discharge status: Against Medical Advice | Payer: Medicare Other | Attending: Internal Medicine | Admitting: Internal Medicine

## 2015-01-10 ENCOUNTER — Emergency Department (INDEPENDENT_AMBULATORY_CARE_PROVIDER_SITE_OTHER)
Admission: EM | Admit: 2015-01-10 | Discharge: 2015-01-10 | Disposition: A | Discharge status: Nursing Home (Includes ICF) | Payer: Medicare Other | Source: Home / Self Care | Attending: Family Medicine | Admitting: Family Medicine

## 2015-01-10 DIAGNOSIS — Z91199 Patient's noncompliance with other medical treatment and regimen due to unspecified reason: Secondary | ICD-10-CM

## 2015-01-10 DIAGNOSIS — D509 Iron deficiency anemia, unspecified: Secondary | ICD-10-CM | POA: Diagnosis present

## 2015-01-10 DIAGNOSIS — F22 Delusional disorders: Secondary | ICD-10-CM | POA: Diagnosis present

## 2015-01-10 DIAGNOSIS — E0590 Thyrotoxicosis, unspecified without thyrotoxic crisis or storm: Secondary | ICD-10-CM | POA: Diagnosis present

## 2015-01-10 DIAGNOSIS — R7989 Other specified abnormal findings of blood chemistry: Secondary | ICD-10-CM | POA: Diagnosis present

## 2015-01-10 DIAGNOSIS — I5030 Unspecified diastolic (congestive) heart failure: Secondary | ICD-10-CM

## 2015-01-10 DIAGNOSIS — E876 Hypokalemia: Secondary | ICD-10-CM | POA: Diagnosis present

## 2015-01-10 DIAGNOSIS — Z7982 Long term (current) use of aspirin: Secondary | ICD-10-CM

## 2015-01-10 DIAGNOSIS — Z9119 Patient's noncompliance with other medical treatment and regimen: Secondary | ICD-10-CM

## 2015-01-10 DIAGNOSIS — R0602 Shortness of breath: Secondary | ICD-10-CM

## 2015-01-10 DIAGNOSIS — R600 Localized edema: Secondary | ICD-10-CM | POA: Diagnosis present

## 2015-01-10 DIAGNOSIS — I5022 Chronic systolic (congestive) heart failure: Secondary | ICD-10-CM | POA: Diagnosis not present

## 2015-01-10 DIAGNOSIS — Z880 Allergy status to penicillin: Secondary | ICD-10-CM

## 2015-01-10 DIAGNOSIS — F17210 Nicotine dependence, cigarettes, uncomplicated: Secondary | ICD-10-CM | POA: Diagnosis present

## 2015-01-10 DIAGNOSIS — G8929 Other chronic pain: Secondary | ICD-10-CM | POA: Diagnosis present

## 2015-01-10 DIAGNOSIS — I5023 Acute on chronic systolic (congestive) heart failure: Secondary | ICD-10-CM | POA: Diagnosis not present

## 2015-01-10 DIAGNOSIS — R609 Edema, unspecified: Secondary | ICD-10-CM | POA: Diagnosis not present

## 2015-01-10 DIAGNOSIS — I429 Cardiomyopathy, unspecified: Secondary | ICD-10-CM

## 2015-01-10 DIAGNOSIS — D649 Anemia, unspecified: Secondary | ICD-10-CM | POA: Diagnosis present

## 2015-01-10 DIAGNOSIS — Z9114 Patient's other noncompliance with medication regimen: Secondary | ICD-10-CM

## 2015-01-10 DIAGNOSIS — Z720 Tobacco use: Secondary | ICD-10-CM | POA: Diagnosis present

## 2015-01-10 DIAGNOSIS — R0609 Other forms of dyspnea: Secondary | ICD-10-CM | POA: Diagnosis not present

## 2015-01-10 DIAGNOSIS — M549 Dorsalgia, unspecified: Secondary | ICD-10-CM

## 2015-01-10 DIAGNOSIS — R778 Other specified abnormalities of plasma proteins: Secondary | ICD-10-CM | POA: Diagnosis present

## 2015-01-10 DIAGNOSIS — I110 Hypertensive heart disease with heart failure: Secondary | ICD-10-CM | POA: Diagnosis present

## 2015-01-10 LAB — CBC WITH DIFFERENTIAL/PLATELET
BASOS ABS: 0 10*3/uL (ref 0.0–0.1)
Basophils Relative: 1 %
Eosinophils Absolute: 0 10*3/uL (ref 0.0–0.7)
Eosinophils Relative: 1 %
HCT: 37.7 % (ref 36.0–46.0)
Hemoglobin: 11.8 g/dL — ABNORMAL LOW (ref 12.0–15.0)
LYMPHS ABS: 2.5 10*3/uL (ref 0.7–4.0)
Lymphocytes Relative: 49 %
MCH: 26.2 pg (ref 26.0–34.0)
MCHC: 31.3 g/dL (ref 30.0–36.0)
MCV: 83.6 fL (ref 78.0–100.0)
MONO ABS: 0.3 10*3/uL (ref 0.1–1.0)
Monocytes Relative: 7 %
Neutro Abs: 2 10*3/uL (ref 1.7–7.7)
Neutrophils Relative %: 42 %
PLATELETS: 153 10*3/uL (ref 150–400)
RBC: 4.51 MIL/uL (ref 3.87–5.11)
RDW: 16.1 % — AB (ref 11.5–15.5)
WBC: 4.8 10*3/uL (ref 4.0–10.5)

## 2015-01-10 LAB — I-STAT TROPONIN, ED: TROPONIN I, POC: 0.26 ng/mL — AB (ref 0.00–0.08)

## 2015-01-10 LAB — COMPREHENSIVE METABOLIC PANEL
ALK PHOS: 146 U/L — AB (ref 38–126)
ALT: 19 U/L (ref 14–54)
ANION GAP: 8 (ref 5–15)
AST: 25 U/L (ref 15–41)
Albumin: 2.3 g/dL — ABNORMAL LOW (ref 3.5–5.0)
BUN: 15 mg/dL (ref 6–20)
CALCIUM: 8.4 mg/dL — AB (ref 8.9–10.3)
CHLORIDE: 103 mmol/L (ref 101–111)
CO2: 26 mmol/L (ref 22–32)
CREATININE: 0.76 mg/dL (ref 0.44–1.00)
Glucose, Bld: 96 mg/dL (ref 65–99)
Potassium: 3 mmol/L — ABNORMAL LOW (ref 3.5–5.1)
Sodium: 137 mmol/L (ref 135–145)
Total Bilirubin: 1.2 mg/dL (ref 0.3–1.2)
Total Protein: 5.4 g/dL — ABNORMAL LOW (ref 6.5–8.1)

## 2015-01-10 LAB — BRAIN NATRIURETIC PEPTIDE

## 2015-01-10 LAB — TROPONIN I: Troponin I: 0.11 ng/mL — ABNORMAL HIGH (ref <0.031)

## 2015-01-10 MED ORDER — FUROSEMIDE 10 MG/ML IJ SOLN
20.0000 mg | Freq: Once | INTRAMUSCULAR | Status: AC
  Administered 2015-01-10: 20 mg via INTRAVENOUS
  Filled 2015-01-10: qty 2

## 2015-01-10 NOTE — ED Notes (Signed)
The patient presented to the [facility] with a complaint of swelling in her legs that started about 6 months ago. The patient was admitted to the [facility] about 6 months ago as well for SOB and edema. The patient's son stated that she was prescribed medicine but has not taken it. The patient stated that she gets SOB when walking short distances.

## 2015-01-10 NOTE — ED Notes (Signed)
MD at bedside. 

## 2015-01-10 NOTE — ED Provider Notes (Signed)
CSN: [id number]     Arrival date & time 01/10/15  1703 History   First MD Initiated Contact with Patient 01/10/15 1848     Chief Complaint  Patient presents with  . Leg Swelling   (Consider location/radiation/quality/duration/timing/severity/associated sxs/prior Treatment) HPI Comments: 79 year old female with a known history of systolic CHF presents with peripheral edema for 6 months. The patient states that he tends to come and go but more recently has gotten much worse. She states that she is forming blisters on her legs. She denies chest pain or shortness of breath at rest. She does have DOE. She was seen in the emergency department several months ago treated with IV Lasix and upon discharge was given oral Lasix. She took them for a short period of time but never followed up with PCP and is not currently taking the medication.   Past Medical History  Diagnosis Date  . Back pain   . Iron deficiency anemia 10/17/2011  . Allergy   . Arthritis    Past Surgical History  Procedure Laterality Date  . Inner ear surgery     History reviewed. No pertinent family history. Social History  Substance Use Topics  . Smoking status: Current Every Day Smoker -- 0.50 packs/day    Types: Cigarettes  . Smokeless tobacco: None  . Alcohol Use: No   OB History    Gravida Para Term Preterm AB TAB SAB Ectopic Multiple Living   0 0 0 0 0 0 0 0       Review of Systems  Constitutional: Positive for activity change. Negative for fever and fatigue.  HENT: Negative.   Respiratory: Positive for shortness of breath. Negative for chest tightness.   Cardiovascular: Positive for leg swelling. Negative for chest pain.  Gastrointestinal: Negative.   Musculoskeletal: Negative.   Skin: Positive for color change. Negative for rash.  Neurological: Negative for seizures, syncope, facial asymmetry and speech difficulty.    Allergies  Penicillins and Penicillins  Home Medications   Prior to Admission  medications   Medication Sig Start Date End Date Taking? Authorizing Provider  aspirin EC 81 MG EC tablet Take 1 tablet (81 mg total) by mouth daily. 06/12/14  Yes Leroy Sea, MD  acetaminophen (TYLENOL) 500 MG tablet Take 500 mg by mouth every 6 (six) hours as needed. pain    Historical Provider, MD  Aspirin-Acetaminophen-Caffeine [phone] MG PACK Take 1 packet by mouth daily as needed. pain    Historical Provider, MD  feeding supplement, ENSURE COMPLETE, (ENSURE COMPLETE) LIQD Take 237 mLs by mouth 3 (three) times daily between meals. 06/12/14   Leroy Sea, MD  ferrous sulfate 325 (65 FE) MG tablet Take 1 tablet (325 mg total) by mouth 3 (three) times daily after meals. Patient not taking: Reported on 03/24/2014 10/18/11 10/17/12  Rodolph Bong, MD  furosemide (LASIX) 40 MG tablet Take 1 tablet (40 mg total) by mouth daily. 06/12/14   Leroy Sea, MD  lisinopril (PRINIVIL,ZESTRIL) 5 MG tablet Take 0.5 tablets (2.5 mg total) by mouth daily. 06/12/14   Leroy Sea, MD  meloxicam (MOBIC) 15 MG tablet Take 1 tablet (15 mg total) by mouth daily. 03/24/14   Carmelina Dane, MD  methimazole (TAPAZOLE) 10 MG tablet Take 2 tablets (20 mg total) by mouth 2 (two) times daily. 06/12/14   Leroy Sea, MD  metoprolol (LOPRESSOR) 50 MG tablet Take 1 tablet (50 mg total) by mouth 2 (two) times daily. 06/12/14   Prashant  Curlene Labrum, MD  nicotine (NICODERM CQ - DOSED IN MG/24 HOURS) 21 mg/24hr patch Place 1 patch (21 mg total) onto the skin daily. 06/12/14   Leroy Sea, MD  potassium chloride 20 MEQ TBCR Take 20 mEq by mouth daily. 06/12/14   Leroy Sea, MD   Meds Ordered and Administered this Visit  Medications - No data to display  BP 133/83 mmHg  Pulse 85  Temp(Src) 97.6 F (36.4 C) (Oral)  Resp 16  SpO2 100% No data found.   Physical Exam  Constitutional: She appears well-developed and well-nourished. No distress.  Neck: Normal range of motion. Neck supple.   Cardiovascular: Normal rate, regular rhythm and normal heart sounds.   Pulmonary/Chest: Effort normal. No respiratory distress.  Mildly diminished BS's but no adventitious sounds  Musculoskeletal: She exhibits edema.  Bilateral LE pitting edema  Neurological: She is alert. No cranial nerve deficit. She exhibits normal muscle tone.  Skin: Skin is warm and dry.  Psychiatric: Her speech is normal. Her affect is blunt. She is agitated. She expresses impulsivity.  Nursing note and vitals reviewed.   ED Course  Procedures (including critical care time)  Labs Review Labs Reviewed - No data to display  Imaging Review No results found.   Visual Acuity Review  Right Eye Distance:   Left Eye Distance:   Bilateral Distance:    Right Eye Near:   Left Eye Near:    Bilateral Near:         MDM   1. Systolic CHF, chronic   2. Peripheral edema   3. DOE (dyspnea on exertion)   4. Personal history of noncompliance with medical treatment, presenting hazards to health    Transfer to [facility] ED for evaluation of DOE, peripheral edema with hx of systolic CHF and EF of 20%.    Hayden Rasmussen, NP 01/10/15 1907

## 2015-01-10 NOTE — ED Notes (Signed)
The patient said she did not want her blood drawn.  She also said she did not want a "foreigner" taking care of her.  I advised her that she might have a "foreigner" taking care of her.  I also advised her I was foreigner and if she was ok with me triaging her.  She said, "I guess so".

## 2015-01-10 NOTE — ED Provider Notes (Signed)
CSN: [id number]     Arrival date & time 01/10/15  1927 History   First MD Initiated Contact with Patient 01/10/15 2149     Chief Complaint  Patient presents with  . Leg Swelling    The patient said she has been having leg swelling on and off for about six months.  The patient said she went to the doctor because she "wanted" to not because she got worse.    Patient is a 79 y.o. female presenting with leg pain.  Leg Pain Location:  Leg Leg location:  L leg and R leg Pain details:    Quality:  Aching and dull   Severity:  Moderate   Onset quality:  Gradual   Timing:  Constant   Progression:  Worsening Chronicity:  Chronic Associated symptoms: decreased ROM and swelling   Associated symptoms: no fever     Past Medical History  Diagnosis Date  . Back pain   . Iron deficiency anemia 10/17/2011  . Allergy   . Arthritis    Past Surgical History  Procedure Laterality Date  . Inner ear surgery     History reviewed. No pertinent family history. Social History  Substance Use Topics  . Smoking status: Current Every Day Smoker -- 0.50 packs/day    Types: Cigarettes  . Smokeless tobacco: None  . Alcohol Use: No   OB History    Gravida Para Term Preterm AB TAB SAB Ectopic Multiple Living   0 0 0 0 0 0 0 0       Review of Systems  Constitutional: Negative for fever and diaphoresis.  Respiratory: Positive for shortness of breath. Negative for cough.   Cardiovascular: Positive for leg swelling. Negative for chest pain and palpitations.  Gastrointestinal: Negative for nausea and vomiting.  All other systems reviewed and are negative.  Allergies  Penicillins and Penicillins  Home Medications   Prior to Admission medications   Medication Sig Start Date End Date Taking? Authorizing Provider  aspirin EC 81 MG EC tablet Take 1 tablet (81 mg total) by mouth daily. Patient not taking: Reported on 01/10/2015 06/12/14   Leroy Sea, MD  feeding supplement, ENSURE COMPLETE, (ENSURE  COMPLETE) LIQD Take 237 mLs by mouth 3 (three) times daily between meals. Patient not taking: Reported on 01/10/2015 06/12/14   Leroy Sea, MD  ferrous sulfate 325 (65 FE) MG tablet Take 1 tablet (325 mg total) by mouth 3 (three) times daily after meals. Patient not taking: Reported on 03/24/2014 10/18/11 10/17/12  Rodolph Bong, MD  furosemide (LASIX) 40 MG tablet Take 1 tablet (40 mg total) by mouth daily. Patient not taking: Reported on 01/10/2015 06/12/14   Leroy Sea, MD  lisinopril (PRINIVIL,ZESTRIL) 5 MG tablet Take 0.5 tablets (2.5 mg total) by mouth daily. Patient not taking: Reported on 01/10/2015 06/12/14   Leroy Sea, MD  meloxicam (MOBIC) 15 MG tablet Take 1 tablet (15 mg total) by mouth daily. Patient not taking: Reported on 01/10/2015 03/24/14   Carmelina Dane, MD  methimazole (TAPAZOLE) 10 MG tablet Take 2 tablets (20 mg total) by mouth 2 (two) times daily. Patient not taking: Reported on 01/10/2015 06/12/14   Leroy Sea, MD  metoprolol (LOPRESSOR) 50 MG tablet Take 1 tablet (50 mg total) by mouth 2 (two) times daily. Patient not taking: Reported on 01/10/2015 06/12/14   Leroy Sea, MD  nicotine (NICODERM CQ - DOSED IN MG/24 HOURS) 21 mg/24hr patch Place 1 patch (21 mg  total) onto the skin daily. Patient not taking: Reported on 01/10/2015 06/12/14   Leroy Sea, MD  potassium chloride 20 MEQ TBCR Take 20 mEq by mouth daily. Patient not taking: Reported on 01/10/2015 06/12/14   Leroy Sea, MD   BP 137/91 mmHg  Pulse 54  Temp(Src) 98.6 F (37 C) (Oral)  Resp 14  Ht  (1.676 m)  Wt 134 lb 1.6 oz (60.827 kg)  BMI 21.65 kg/m2  SpO2 94% Physical Exam  Constitutional: She is oriented to person, place, and time. She appears well-developed and well-nourished. No distress.  HENT:  Head: Normocephalic.  Eyes: Pupils are equal, round, and reactive to light.  Neck: Normal range of motion.  Cardiovascular:  Bradycardic  Pulmonary/Chest: Effort  normal. No respiratory distress. She has no wheezes. She exhibits no tenderness.  Mild bilateral crackles in lower lobes  Abdominal: Soft. She exhibits no distension.  Musculoskeletal: She exhibits edema.  Bilateral +2 weeping edema in lower extremities  Neurological: She is alert and oriented to person, place, and time. She exhibits normal muscle tone.  Skin: Skin is warm and dry. She is not diaphoretic.  Psychiatric: Her behavior is normal.  Nursing note and vitals reviewed.   ED Course  Procedures (including critical care time) Labs Review Labs Reviewed  COMPREHENSIVE METABOLIC PANEL - Abnormal; Notable for the following:    Potassium 3.0 (*)    Calcium 8.4 (*)    Total Protein 5.4 (*)    Albumin 2.3 (*)    Alkaline Phosphatase 146 (*)    All other components within normal limits  BRAIN NATRIURETIC PEPTIDE - Abnormal; Notable for the following:    B Natriuretic Peptide >4500.0 (*)    All other components within normal limits  CBC WITH DIFFERENTIAL/PLATELET - Abnormal; Notable for the following:    Hemoglobin 11.8 (*)    RDW 16.1 (*)    All other components within normal limits  TROPONIN I - Abnormal; Notable for the following:    Troponin I 0.11 (*)    All other components within normal limits  I-STAT TROPOININ, ED - Abnormal; Notable for the following:    Troponin i, poc 0.26 (*)    All other components within normal limits   Imaging Review Dg Chest 2 View  01/10/2015   CLINICAL DATA:  Weakness today.  EXAM: CHEST  2 VIEW  COMPARISON:  February 12, 2010  FINDINGS: The heart size is enlarged. The mediastinal contour is normal. There is consolidation of right lung base with right pleural effusion. There is minimal left pleural effusion. There is no pulmonary edema. No acute abnormalities identified within the visualized bones.  IMPRESSION: Right lung base pneumonia with right pleural effusion. Minimal left pleural effusion.  Cardiomegaly.   Electronically Signed   By: Sherian Rein M.D.   On: 01/10/2015 22:58   I have personally reviewed and evaluated these images and lab results as part of my medical decision-making.   EKG Interpretation   Date/Time:  Monday January 10 2015 22:33:28 EDT Ventricular Rate:  91 PR Interval:  159 QRS Duration: 93 QT Interval:  396 QTC Calculation: 487 R Axis:   137 Text Interpretation:  Sinus rhythm Atrial premature complexes Anterior  infarct, old Abnormal T, consider ischemia, lateral leads No prior EKG for  comparison Confirmed by LIU MD, DANA [phone]) on 01/10/2015 10:37:38 PM      MDM   This patient presents as a 79 year old female who presents to the emergency department today  with increase in bilateral lower extremity swelling as well as worsening shortness of breath for the past 6 months. She has a known documented history of congestive heart however does not take her medications as prescribed. Patient states denies any chest pain with this shortness of breath. Her physical exam is remarkable for bilateral lower extremity edema. She denies any fevers and chest x-ray shows questionable right lung base; however I believe this is more likely due to a right pleural effusion given the fact that she does not have any clinical signs of pneumonia. Will not treat empirically with antibiotics at this time. At this time we do not believe her elevated troponin to be due to a an STEMI however and believe it to be more likely due to congestive heart failure exacerbation. Given her bilateral pulmonary edema on clinical exam as well as congestive heart failure exam findings of labs and chest x-ray will believe this to be much more likely source than a likely source of pulmonary embolism. Patient low Wells criteria. Patient was admitted to the hospital for further management.  Final diagnoses:  Diastolic congestive heart failure, unspecified congestive heart failure chronicity  Bilateral leg edema  Shortness of breath      Deirdre Peer, MD 01/11/15 1610  Lavera Guise, MD 01/11/15 0025

## 2015-01-10 NOTE — ED Notes (Signed)
The patient said she has been having leg swelling on and off for about six months.  The patient said she went to the doctor because she "wanted" to not because she got worse.

## 2015-01-11 ENCOUNTER — Encounter (HOSPITAL_COMMUNITY): Payer: Self-pay | Admitting: *Deleted

## 2015-01-11 ENCOUNTER — Ambulatory Visit (HOSPITAL_COMMUNITY): Payer: Medicare Other

## 2015-01-11 DIAGNOSIS — Z91199 Patient's noncompliance with other medical treatment and regimen due to unspecified reason: Secondary | ICD-10-CM

## 2015-01-11 DIAGNOSIS — R7989 Other specified abnormal findings of blood chemistry: Secondary | ICD-10-CM

## 2015-01-11 DIAGNOSIS — R079 Chest pain, unspecified: Secondary | ICD-10-CM

## 2015-01-11 DIAGNOSIS — F22 Delusional disorders: Secondary | ICD-10-CM | POA: Diagnosis present

## 2015-01-11 DIAGNOSIS — R600 Localized edema: Secondary | ICD-10-CM | POA: Diagnosis not present

## 2015-01-11 DIAGNOSIS — D649 Anemia, unspecified: Secondary | ICD-10-CM

## 2015-01-11 DIAGNOSIS — I5023 Acute on chronic systolic (congestive) heart failure: Secondary | ICD-10-CM | POA: Diagnosis present

## 2015-01-11 DIAGNOSIS — D509 Iron deficiency anemia, unspecified: Secondary | ICD-10-CM | POA: Diagnosis present

## 2015-01-11 DIAGNOSIS — E876 Hypokalemia: Secondary | ICD-10-CM | POA: Diagnosis present

## 2015-01-11 DIAGNOSIS — Z9119 Patient's noncompliance with other medical treatment and regimen: Secondary | ICD-10-CM | POA: Diagnosis not present

## 2015-01-11 DIAGNOSIS — Z9114 Patient's other noncompliance with medication regimen: Secondary | ICD-10-CM | POA: Diagnosis not present

## 2015-01-11 DIAGNOSIS — E0590 Thyrotoxicosis, unspecified without thyrotoxic crisis or storm: Secondary | ICD-10-CM | POA: Diagnosis present

## 2015-01-11 DIAGNOSIS — I429 Cardiomyopathy, unspecified: Secondary | ICD-10-CM

## 2015-01-11 DIAGNOSIS — M549 Dorsalgia, unspecified: Secondary | ICD-10-CM | POA: Diagnosis not present

## 2015-01-11 DIAGNOSIS — Z880 Allergy status to penicillin: Secondary | ICD-10-CM | POA: Diagnosis not present

## 2015-01-11 DIAGNOSIS — F17210 Nicotine dependence, cigarettes, uncomplicated: Secondary | ICD-10-CM | POA: Diagnosis present

## 2015-01-11 DIAGNOSIS — F600 Paranoid personality disorder: Secondary | ICD-10-CM | POA: Diagnosis not present

## 2015-01-11 DIAGNOSIS — R778 Other specified abnormalities of plasma proteins: Secondary | ICD-10-CM | POA: Diagnosis present

## 2015-01-11 DIAGNOSIS — G8929 Other chronic pain: Secondary | ICD-10-CM

## 2015-01-11 DIAGNOSIS — Z7982 Long term (current) use of aspirin: Secondary | ICD-10-CM | POA: Diagnosis not present

## 2015-01-11 DIAGNOSIS — R0602 Shortness of breath: Secondary | ICD-10-CM | POA: Diagnosis present

## 2015-01-11 DIAGNOSIS — I110 Hypertensive heart disease with heart failure: Secondary | ICD-10-CM | POA: Diagnosis present

## 2015-01-11 LAB — CBC WITH DIFFERENTIAL/PLATELET
BASOS PCT: 1 %
Basophils Absolute: 0 10*3/uL (ref 0.0–0.1)
EOS PCT: 1 %
Eosinophils Absolute: 0.1 10*3/uL (ref 0.0–0.7)
HEMATOCRIT: 35.4 % — AB (ref 36.0–46.0)
Hemoglobin: 11.3 g/dL — ABNORMAL LOW (ref 12.0–15.0)
Lymphocytes Relative: 41 %
Lymphs Abs: 1.7 10*3/uL (ref 0.7–4.0)
MCH: 26.3 pg (ref 26.0–34.0)
MCHC: 31.9 g/dL (ref 30.0–36.0)
MCV: 82.5 fL (ref 78.0–100.0)
MONO ABS: 0.3 10*3/uL (ref 0.1–1.0)
MONOS PCT: 8 %
NEUTROS ABS: 2.1 10*3/uL (ref 1.7–7.7)
Neutrophils Relative %: 49 %
PLATELETS: 161 10*3/uL (ref 150–400)
RBC: 4.29 MIL/uL (ref 3.87–5.11)
RDW: 16 % — AB (ref 11.5–15.5)
WBC: 4.2 10*3/uL (ref 4.0–10.5)

## 2015-01-11 LAB — COMPREHENSIVE METABOLIC PANEL
ALBUMIN: 2.3 g/dL — AB (ref 3.5–5.0)
ALK PHOS: 142 U/L — AB (ref 38–126)
ALT: 18 U/L (ref 14–54)
ANION GAP: 10 (ref 5–15)
AST: 23 U/L (ref 15–41)
BILIRUBIN TOTAL: 1.1 mg/dL (ref 0.3–1.2)
BUN: 15 mg/dL (ref 6–20)
CALCIUM: 8.3 mg/dL — AB (ref 8.9–10.3)
CO2: 26 mmol/L (ref 22–32)
CREATININE: 0.77 mg/dL (ref 0.44–1.00)
Chloride: 101 mmol/L (ref 101–111)
GFR calc Af Amer: >60 mL/min (ref >60)
GFR calc non Af Amer: >60 mL/min (ref >60)
GLUCOSE: 90 mg/dL (ref 65–99)
Potassium: 2.8 mmol/L — ABNORMAL LOW (ref 3.5–5.1)
SODIUM: 137 mmol/L (ref 135–145)
Total Protein: 5.4 g/dL — ABNORMAL LOW (ref 6.5–8.1)

## 2015-01-11 LAB — PROTIME-INR
INR: 1.35 (ref 0.00–1.49)
Prothrombin Time: 16.8 seconds — ABNORMAL HIGH (ref 11.6–15.2)

## 2015-01-11 LAB — MAGNESIUM: Magnesium: 1.7 mg/dL (ref 1.7–2.4)

## 2015-01-11 LAB — TROPONIN I
TROPONIN I: 0.09 ng/mL — AB (ref <0.031)
Troponin I: 0.1 ng/mL — ABNORMAL HIGH (ref <0.031)
Troponin I: 0.12 ng/mL — ABNORMAL HIGH (ref <0.031)

## 2015-01-11 LAB — T4, FREE: FREE T4: 4.06 ng/dL — AB (ref 0.61–1.12)

## 2015-01-11 LAB — TSH: TSH: 0.013 u[IU]/mL — ABNORMAL LOW (ref 0.350–4.500)

## 2015-01-11 MED ORDER — SPIRONOLACTONE 25 MG PO TABS
25.0000 mg | ORAL_TABLET | Freq: Every day | ORAL | Status: DC
  Administered 2015-01-11 – 2015-01-14 (×4): 25 mg via ORAL
  Filled 2015-01-11 (×5): qty 1

## 2015-01-11 MED ORDER — ENOXAPARIN SODIUM 40 MG/0.4ML ~~LOC~~ SOLN
40.0000 mg | SUBCUTANEOUS | Status: DC
  Administered 2015-01-11 – 2015-01-14 (×3): 40 mg via SUBCUTANEOUS
  Filled 2015-01-11 (×3): qty 0.4

## 2015-01-11 MED ORDER — INFLUENZA VAC SPLIT QUAD 0.5 ML IM SUSY
0.5000 mL | PREFILLED_SYRINGE | INTRAMUSCULAR | Status: DC
  Filled 2015-01-11: qty 0.5

## 2015-01-11 MED ORDER — ASPIRIN EC 81 MG PO TBEC
81.0000 mg | DELAYED_RELEASE_TABLET | Freq: Every day | ORAL | Status: DC
  Administered 2015-01-11 – 2015-01-15 (×5): 81 mg via ORAL
  Filled 2015-01-11 (×5): qty 1

## 2015-01-11 MED ORDER — POTASSIUM CHLORIDE 10 MEQ/100ML IV SOLN
10.0000 meq | INTRAVENOUS | Status: AC
Start: 2015-01-11 — End: 2015-01-11
  Administered 2015-01-11 (×3): 10 meq via INTRAVENOUS
  Filled 2015-01-11 (×3): qty 100

## 2015-01-11 MED ORDER — FUROSEMIDE 10 MG/ML IJ SOLN
40.0000 mg | Freq: Two times a day (BID) | INTRAMUSCULAR | Status: DC
  Administered 2015-01-11 – 2015-01-14 (×6): 40 mg via INTRAVENOUS
  Filled 2015-01-11 (×8): qty 4

## 2015-01-11 MED ORDER — SODIUM CHLORIDE 0.9 % IV SOLN: 250.0000 mL | INTRAVENOUS | Status: DC | PRN

## 2015-01-11 MED ORDER — FUROSEMIDE 10 MG/ML IJ SOLN: 40.0000 mg | Freq: Two times a day (BID) | INTRAMUSCULAR | Status: DC

## 2015-01-11 MED ORDER — ASPIRIN 81 MG PO CHEW
324.0000 mg | CHEWABLE_TABLET | Freq: Once | ORAL | Status: AC
  Administered 2015-01-11: 324 mg via ORAL
  Filled 2015-01-11: qty 4

## 2015-01-11 MED ORDER — LISINOPRIL 2.5 MG PO TABS
2.5000 mg | ORAL_TABLET | Freq: Every day | ORAL | Status: DC
  Administered 2015-01-12 – 2015-01-14 (×3): 2.5 mg via ORAL
  Filled 2015-01-11 (×5): qty 1

## 2015-01-11 MED ORDER — SODIUM CHLORIDE 0.9 % IJ SOLN
3.0000 mL | Freq: Two times a day (BID) | INTRAMUSCULAR | Status: DC
  Administered 2015-01-11 – 2015-01-14 (×8): 3 mL via INTRAVENOUS

## 2015-01-11 MED ORDER — SODIUM CHLORIDE 0.9 % IJ SOLN: 3.0000 mL | INTRAMUSCULAR | Status: DC | PRN

## 2015-01-11 MED ORDER — POTASSIUM CHLORIDE 10 MEQ/100ML IV SOLN
10.0000 meq | INTRAVENOUS | Status: DC
  Administered 2015-01-11: 10 meq via INTRAVENOUS
  Filled 2015-01-11: qty 100

## 2015-01-11 MED ORDER — METOPROLOL SUCCINATE ER 25 MG PO TB24
25.0000 mg | ORAL_TABLET | Freq: Every day | ORAL | Status: DC
  Administered 2015-01-12 – 2015-01-14 (×3): 25 mg via ORAL
  Filled 2015-01-11 (×5): qty 1

## 2015-01-11 MED ORDER — POTASSIUM CHLORIDE CRYS ER 20 MEQ PO TBCR
40.0000 meq | EXTENDED_RELEASE_TABLET | Freq: Once | ORAL | Status: AC
  Administered 2015-01-11: 40 meq via ORAL
  Filled 2015-01-11: qty 2

## 2015-01-11 MED ORDER — ONDANSETRON HCL 4 MG/2ML IJ SOLN
4.0000 mg | Freq: Four times a day (QID) | INTRAMUSCULAR | Status: DC | PRN
  Administered 2015-01-12: 4 mg via INTRAVENOUS
  Filled 2015-01-11: qty 2

## 2015-01-11 MED ORDER — FUROSEMIDE 10 MG/ML IJ SOLN
40.0000 mg | Freq: Every day | INTRAMUSCULAR | Status: DC
  Administered 2015-01-11: 20 mg via INTRAVENOUS
  Filled 2015-01-11: qty 4

## 2015-01-11 MED ORDER — ACETAMINOPHEN 325 MG PO TABS
650.0000 mg | ORAL_TABLET | ORAL | Status: DC | PRN
  Administered 2015-01-12: 650 mg via ORAL
  Filled 2015-01-11: qty 2

## 2015-01-11 NOTE — Plan of Care (Signed)
Problem: Phase I Progression Outcomes Goal: EF % per last Echo/documented,Core Reminder form on chart Outcome: Completed/Met Date Met:  01/11/15 Performed Echo today 01/11/2015  Echo result is  - 15%

## 2015-01-11 NOTE — Care Management Note (Signed)
Case Management Note  Patient Details  Name: Julie Charles MRN: [id number] Date of Birth: 03-30-1936  Subjective/Objective:            Admitted with CHF        Action/Plan: Patient lives at home with son Carney Bern [phone])- very intelligent/ knowledgeable of his mothers care. CM attempted to talk to the patient but she does not want to talk or to be bothered at this time. Daughter at bedside stated that she is tired of them taking her blood and doing test. CM informed the patient and daughter the routine of hospital care and that the physician ordered test to know what's going on in her body. The patient and daughter is fixated on how much blood they are taking at a time. CM informed them that the tubes are small and that her body is producing more blood. Also patient refused to work with Physical Therapy. Encouragement given to work with therapy. Permission given from patient to talk to her son Carney Bern. TCT Carney Bern- CM informed him that at times with the Elderly, it is hard for them to be in the hospital and that we are constantly doing something to them when they are sick and want to be left alone. Carney Bern understood and stated that he will come to the hospital ( he is currently at work) and talk to his mother to get her to become more compliant. She goes to the Urgent [facility] when she is sick and goes to multiple pharmacies. CM informed Carney Bern that we encourage patient's to use one pharmacy not multiple pharmacies. She ambulates with a cane at home. CM asked Carney Bern about a statement that his mother was saying " foreigners releasing gas." Carney Bern stated that is was her imagination. Difficult case, CM will continue to follow for DCP.  Expected Discharge Date:  Possibly 01/16/2015              Expected Discharge Plan:   home vs short term SNF  Discharge planning Services  CM Consult  Status of Service:  In process, will continue to follow   Cherrie Distance RN ,MHA,BSN [phone]   01/11/2015, 2:06 PM

## 2015-01-11 NOTE — Progress Notes (Signed)
  Echocardiogram 2D Echocardiogram- Limited  has been performed.  Leta Jungling M 01/11/2015, 3:24 PM

## 2015-01-11 NOTE — H&P (Signed)
[facility] History and Physical  Patient: Julie Charles  MRN: [id number]  DOB: 1935/11/18  DOS: the patient was seen and examined on 01/11/2015 PCP: No PCP Per Patient  Referring physician: Dr. Verdie Mosher Chief Complaint: Leg swelling  HPI: Mikel Pyon is a 79 y.o. female with Past medical history of chronic systolic heart failure, anemia, thyroid disease, essential hypertension. The patient is presenting with complaints of progressively worsening leg swelling ongoing for last 2 months. Patient also mentions she has been gaining some weight. She thinks that "some of the foreigners around her house are putting some gas out which is causing her to have the leg swelling". She denies having any chest pain or chest tightness. No fever no cough. She feels always cold. She denies having any palpitation. No complaints of nausea vomiting or diarrhea. She denies any active bleeding. She denies any burning urination. She denies any fall trauma or injury. At her baseline she only takes aspirin and does not take any other medications. She does not see any physician on a regular basis.  The patient is coming from home.  At her baseline ambulates with support And is independent for most of her ADL; manages her medication on her own.  Review of Systems: as mentioned in the history of present illness.  A comprehensive review of the other systems is negative.  Past Medical History  Diagnosis Date  . Back pain   . Iron deficiency anemia 10/17/2011  . Allergy   . Arthritis    Past Surgical History  Procedure Laterality Date  . Inner ear surgery     Social History:  reports that she has been smoking Cigarettes.  She has been smoking about 0.50 packs per day. She does not have any smokeless tobacco history on file. She reports that she does not drink alcohol or use illicit drugs.  Allergies  Allergen Reactions  . Penicillins     Unknown reaction  . Penicillins Rash    History reviewed. No  pertinent family history.  Prior to Admission medications   Medication Sig Start Date End Date Taking? Authorizing Provider  aspirin EC 81 MG EC tablet Take 1 tablet (81 mg total) by mouth daily. Patient not taking: Reported on 01/10/2015 06/12/14   Leroy Sea, MD  feeding supplement, ENSURE COMPLETE, (ENSURE COMPLETE) LIQD Take 237 mLs by mouth 3 (three) times daily between meals. Patient not taking: Reported on 01/10/2015 06/12/14   Leroy Sea, MD  ferrous sulfate 325 (65 FE) MG tablet Take 1 tablet (325 mg total) by mouth 3 (three) times daily after meals. Patient not taking: Reported on 03/24/2014 10/18/11 10/17/12  Rodolph Bong, MD  furosemide (LASIX) 40 MG tablet Take 1 tablet (40 mg total) by mouth daily. Patient not taking: Reported on 01/10/2015 06/12/14   Leroy Sea, MD  lisinopril (PRINIVIL,ZESTRIL) 5 MG tablet Take 0.5 tablets (2.5 mg total) by mouth daily. Patient not taking: Reported on 01/10/2015 06/12/14   Leroy Sea, MD  meloxicam (MOBIC) 15 MG tablet Take 1 tablet (15 mg total) by mouth daily. Patient not taking: Reported on 01/10/2015 03/24/14   Carmelina Dane, MD  methimazole (TAPAZOLE) 10 MG tablet Take 2 tablets (20 mg total) by mouth 2 (two) times daily. Patient not taking: Reported on 01/10/2015 06/12/14   Leroy Sea, MD  metoprolol (LOPRESSOR) 50 MG tablet Take 1 tablet (50 mg total) by mouth 2 (two) times daily. Patient not taking: Reported on 01/10/2015 06/12/14   Bess Harvest  Curlene Labrum, MD  nicotine (NICODERM CQ - DOSED IN MG/24 HOURS) 21 mg/24hr patch Place 1 patch (21 mg total) onto the skin daily. Patient not taking: Reported on 01/10/2015 06/12/14   Leroy Sea, MD  potassium chloride 20 MEQ TBCR Take 20 mEq by mouth daily. Patient not taking: Reported on 01/10/2015 06/12/14   Leroy Sea, MD    Physical Exam: Filed Vitals:   01/10/15 2230 01/11/15 0000 01/11/15 0142 01/11/15 0440  BP: 90/65 129/89 133/91 130/78  Pulse: 93 106 122 92    Temp:   97.6 F (36.4 C) 98.1 F (36.7 C)  TempSrc:   Oral Oral  Resp:  35 18 17  Height:    (1.676 m)   Weight:   59.557 kg (131 lb 4.8 oz)   SpO2: 99% 100% 100% 98%    General: Alert, Awake and Oriented to Time, Place and Person. Appear in mild distress Eyes: PERRL ENT: Oral Mucosa clear moist. Neck: no JVD Cardiovascular: S1 and S2 Present, no Murmur, Peripheral Pulses Present Respiratory: Bilateral Air entry equal and Decreased,  Basal Crackles, no wheezes Abdomen: Bowel Sound present, Soft and no tenderness Skin: no Rash Extremities: Bilateral Pedal edema, no calf tenderness Neurologic: Grossly no focal neuro deficit.  Labs on Admission:  CBC:  Recent Labs Lab 01/10/15 2241 01/11/15 0306  WBC 4.8 4.2  NEUTROABS 2.0 2.1  HGB 11.8* 11.3*  HCT 37.7 35.4*  MCV 83.6 82.5  PLT 153 161    CMP     Component Value Date/Time   NA 137 01/11/2015 0306   K 2.8* 01/11/2015 0306   CL 101 01/11/2015 0306   CO2 26 01/11/2015 0306   GLUCOSE 90 01/11/2015 0306   BUN 15 01/11/2015 0306   CREATININE 0.77 01/11/2015 0306   CALCIUM 8.3* 01/11/2015 0306   PROT 5.4* 01/11/2015 0306   ALBUMIN 2.3* 01/11/2015 0306   AST 23 01/11/2015 0306   ALT 18 01/11/2015 0306   ALKPHOS 142* 01/11/2015 0306   BILITOT 1.1 01/11/2015 0306   GFRNONAA >60 01/11/2015 0306   GFRAA >60 01/11/2015 0306     Recent Labs Lab 01/10/15 2314 01/11/15 0306  TROPONINI 0.11* 0.09*   BNP (last 3 results)  Recent Labs  06/10/14 2209 01/10/15 2242  BNP 2126.1* >4500.0*    ProBNP (last 3 results) No results for input(s): PROBNP in the last 8760 hours.   Radiological Exams on Admission: Dg Chest 2 View  01/10/2015   CLINICAL DATA:  Weakness today.  EXAM: CHEST  2 VIEW  COMPARISON:  February 12, 2010  FINDINGS: The heart size is enlarged. The mediastinal contour is normal. There is consolidation of right lung base with right pleural effusion. There is minimal left pleural effusion. There  is no pulmonary edema. No acute abnormalities identified within the visualized bones.  IMPRESSION: Right lung base pneumonia with right pleural effusion. Minimal left pleural effusion.  Cardiomegaly.   Electronically Signed   By: Sherian Rein M.D.   On: 01/10/2015 22:58   EKG: Independently reviewed. normal sinus rhythm, nonspecific ST and T waves changes.  Assessment/Plan 1. Acute on chronic systolic CHF (congestive heart failure) The patient is presenting with complaints of progressively worsening leg swelling. She also complains of occasional dyspnea on exertion. Chest x-ray shows possible right base pneumonia although the patient does not have any symptoms of pneumonia. No fever no chills no leukocytosis. With this we will treat the patient has a possible acute on chronic CHF with  systolic dysfunction. The patient has received 40 mg of Lasix. We will continue her on Lasix in the morning. Daily weight as well as ins and outs. Patient will also be resumed on lisinopril as well as Toprol-XL. Follow serial troponin as well as check an echocardiogram in the morning.  2.elevated troponin. Most likely associated with the ongoing CHF exacerbation. Troponins are trending downwards. We will continue to closely monitor. Cardiology consult in the morning.  3 Anemia Patient has chronic anemia. Denies any active bleeding. We will continue to closely monitor.  4 thyroid disease. Patient was on methimazole in the past. At present I will recheck her TSH.  5  Hypokalemia Patient appears to have hypokalemia on double that she has also significantly milligram of IV Lasix. I would recheck her potassium as well as magnesium. Replace as needed.  Nutrition: Nothing by mouth after midnight DVT Prophylaxis: subcutaneous Heparin  Advance goals of care discussion: DNR/DNI as per my discussion with patient   Consults: Cardiology Family Communication: family was present at bedside, opportunity was  given to ask question and all questions were answered satisfactorily at the time of interview. Disposition: Admitted as inpatient Telemetry unit. Estimated length of stay: 2-3 days  Author: Lynden Oxford, MD [facility] Pager: [phone] 01/11/2015  If 7PM-7AM, please contact night-coverage [email or web address] Password [email or web address]

## 2015-01-11 NOTE — Consult Note (Addendum)
Reason for Consult:   CHF  Requesting Physician: Teaching service Primary Cardiologist ? M. Skains (never seen in the office)  HPI:  Asked to see this unfortunate 79 y/o AA female for CHF. The pt has obvious cognitive deficits (as does her daughter who is also present) and I was unable to get any cardiac history from the pt or family, only that she had seen heart doctors in the past. She apparently lives with her son. The pt was admitted in Feb 2016 with CHF and was found to be hyperthyroid. Her EF by echo then showed an EF of 20-25%.  She was discharged but stopped all her medicine and never followed up with anyone as an OP. She is admitted now with LE edema to her knees. She attributes this to "foreigners releasing gas in my home".  She denies any chest pain. Sh denies any SOB "I don't do anything". Says she wants to go home.   PMHx:  Past Medical History  Diagnosis Date  . Back pain   . Iron deficiency anemia 10/17/2011  . Allergy   . Arthritis     Past Surgical History  Procedure Laterality Date  . Inner ear surgery      SOCHx:  reports that she has been smoking Cigarettes.  She has been smoking about 0.50 packs per day. She does not have any smokeless tobacco history on file. She reports that she does not drink alcohol or use illicit drugs.  FAMHx: History reviewed. No pertinent family history.  ALLERGIES: Allergies  Allergen Reactions  . Penicillins     Unknown reaction  . Penicillins Rash    ROS: Review of Systems: Pt was unable to give a reliable ROS    HOME MEDICATIONS: Prior to Admission medications   Medication Sig Start Date End Date Taking? Authorizing Provider  aspirin EC 81 MG EC tablet Take 1 tablet (81 mg total) by mouth daily. Patient not taking: Reported on 01/10/2015 06/12/14   Leroy Sea, MD  feeding supplement, ENSURE COMPLETE, (ENSURE COMPLETE) LIQD Take 237 mLs by mouth 3 (three) times daily between meals. Patient not taking:  Reported on 01/10/2015 06/12/14   Leroy Sea, MD  ferrous sulfate 325 (65 FE) MG tablet Take 1 tablet (325 mg total) by mouth 3 (three) times daily after meals. Patient not taking: Reported on 03/24/2014 10/18/11 10/17/12  Rodolph Bong, MD  furosemide (LASIX) 40 MG tablet Take 1 tablet (40 mg total) by mouth daily. Patient not taking: Reported on 01/10/2015 06/12/14   Leroy Sea, MD  lisinopril (PRINIVIL,ZESTRIL) 5 MG tablet Take 0.5 tablets (2.5 mg total) by mouth daily. Patient not taking: Reported on 01/10/2015 06/12/14   Leroy Sea, MD  meloxicam (MOBIC) 15 MG tablet Take 1 tablet (15 mg total) by mouth daily. Patient not taking: Reported on 01/10/2015 03/24/14   Carmelina Dane, MD  methimazole (TAPAZOLE) 10 MG tablet Take 2 tablets (20 mg total) by mouth 2 (two) times daily. Patient not taking: Reported on 01/10/2015 06/12/14   Leroy Sea, MD  metoprolol (LOPRESSOR) 50 MG tablet Take 1 tablet (50 mg total) by mouth 2 (two) times daily. Patient not taking: Reported on 01/10/2015 06/12/14   Leroy Sea, MD  nicotine (NICODERM CQ - DOSED IN MG/24 HOURS) 21 mg/24hr patch Place 1 patch (21 mg total) onto the skin daily. Patient not taking: Reported on 01/10/2015 06/12/14   Leroy Sea, MD  potassium chloride 20 MEQ TBCR Take 20 mEq by mouth daily. Patient not taking: Reported on 01/10/2015 06/12/14   Leroy Sea, MD    HOSPITAL MEDICATIONS:  Current facility-administered medications:  .  0.9 %  sodium chloride infusion, 250 mL, Intravenous, PRN, Rolly Salter, MD .  acetaminophen (TYLENOL) tablet 650 mg, 650 mg, Oral, Q4H PRN, Rolly Salter, MD .  aspirin EC tablet 81 mg, 81 mg, Oral, Daily, Rolly Salter, MD, 81 mg at 01/11/15 1116 .  enoxaparin (LOVENOX) injection 40 mg, 40 mg, Subcutaneous, Q24H, Rolly Salter, MD, 40 mg at 01/11/15 1116 .  furosemide (LASIX) injection 40 mg, 40 mg, Intravenous, Daily, Rolly Salter, MD, 20 mg at 01/11/15 1115 .  [START  ON 01/12/2015] Influenza vac split quadrivalent PF (FLUARIX) injection 0.5 mL, 0.5 mL, Intramuscular, Tomorrow-1000, Rolly Salter, MD .  lisinopril (PRINIVIL,ZESTRIL) tablet 2.5 mg, 2.5 mg, Oral, Daily, Rolly Salter, MD, 2.5 mg at 01/11/15 1113 .  metoprolol succinate (TOPROL-XL) 24 hr tablet 25 mg, 25 mg, Oral, Daily, Rolly Salter, MD, 25 mg at 01/11/15 1114 .  ondansetron (ZOFRAN) injection 4 mg, 4 mg, Intravenous, Q6H PRN, Rolly Salter, MD .  potassium chloride 10 mEq in 100 mL IVPB, 10 mEq, Intravenous, Q2H, Maryann Mikhail, DO, 10 mEq at 01/11/15 1210 .  sodium chloride 0.9 % injection 3 mL, 3 mL, Intravenous, Q12H, Rolly Salter, MD, 3 mL at 01/11/15 0319 .  sodium chloride 0.9 % injection 3 mL, 3 mL, Intravenous, PRN, Rolly Salter, MD  VITALS: Blood pressure 90/69, pulse 103, temperature 97.5 F (36.4 C), temperature source Oral, resp. rate 16, height  (1.676 m), weight 131 lb 4.8 oz (59.557 kg), SpO2 100 %.  PHYSICAL EXAM: General appearance: alert, cooperative, appears older than stated age, cachectic and no distress. Dishelved Neck: JVD noted Lungs: basilar crackles Heart: regular rate and rhythm Abdomen: soft, non-tender; bowel sounds normal; no masses,  no organomegaly Extremities: 3+ edema to her knees bilaterally with blisters on left Pulses: diminnished Skin: pale, cool, dry Neurologic: Grossly normal  LABS: Results for orders placed or performed during the hospital encounter of 01/10/15 (from the past 24 hour(s))  Comprehensive metabolic panel     Status: Abnormal   Collection Time: 01/10/15 10:41 PM  Result Value Ref Range   Sodium 137 135 - 145 mmol/L   Potassium 3.0 (L) 3.5 - 5.1 mmol/L   Chloride 103 101 - 111 mmol/L   CO2 26 22 - 32 mmol/L   Glucose, Bld 96 65 - 99 mg/dL   BUN 15 6 - 20 mg/dL   Creatinine, Ser 1.61 0.44 - 1.00 mg/dL   Calcium 8.4 (L) 8.9 - 10.3 mg/dL   Total Protein 5.4 (L) 6.5 - 8.1 g/dL   Albumin 2.3 (L) 3.5 - 5.0 g/dL   AST  25 15 - 41 U/L   ALT 19 14 - 54 U/L   Alkaline Phosphatase 146 (H) 38 - 126 U/L   Total Bilirubin 1.2 0.3 - 1.2 mg/dL   GFR calc non Af Amer >60 >60 mL/min   GFR calc Af Amer >60 >60 mL/min   Anion gap 8 5 - 15  CBC with Differential     Status: Abnormal   Collection Time: 01/10/15 10:41 PM  Result Value Ref Range   WBC 4.8 4.0 - 10.5 K/uL   RBC 4.51 3.87 - 5.11 MIL/uL   Hemoglobin 11.8 (L) 12.0 - 15.0 g/dL  HCT 37.7 36.0 - 46.0 %   MCV 83.6 78.0 - 100.0 fL   MCH 26.2 26.0 - 34.0 pg   MCHC 31.3 30.0 - 36.0 g/dL   RDW 16.1 (H) 09.6 - 04.5 %   Platelets 153 150 - 400 K/uL   Neutrophils Relative % 42 %   Lymphocytes Relative 49 %   Monocytes Relative 7 %   Eosinophils Relative 1 %   Basophils Relative 1 %   Neutro Abs 2.0 1.7 - 7.7 K/uL   Lymphs Abs 2.5 0.7 - 4.0 K/uL   Monocytes Absolute 0.3 0.1 - 1.0 K/uL   Eosinophils Absolute 0.0 0.0 - 0.7 K/uL   Basophils Absolute 0.0 0.0 - 0.1 K/uL   RBC Morphology POLYCHROMASIA PRESENT   Brain natriuretic peptide     Status: Abnormal   Collection Time: 01/10/15 10:42 PM  Result Value Ref Range   B Natriuretic Peptide >4500.0 (H) 0.0 - 100.0 pg/mL  I-Stat Troponin, ED (not at [facility])     Status: Abnormal   Collection Time: 01/10/15 10:48 PM  Result Value Ref Range   Troponin i, poc 0.26 (HH) 0.00 - 0.08 ng/mL   Comment NOTIFIED PHYSICIAN    Comment 3          Troponin I     Status: Abnormal   Collection Time: 01/10/15 11:14 PM  Result Value Ref Range   Troponin I 0.11 (H) <0.031 ng/mL  Troponin I     Status: Abnormal   Collection Time: 01/11/15  3:06 AM  Result Value Ref Range   Troponin I 0.09 (H) <0.031 ng/mL  TSH     Status: Abnormal   Collection Time: 01/11/15  3:06 AM  Result Value Ref Range   TSH 0.013 (L) 0.350 - 4.500 uIU/mL  Protime-INR     Status: Abnormal   Collection Time: 01/11/15  3:06 AM  Result Value Ref Range   Prothrombin Time 16.8 (H) 11.6 - 15.2 seconds   INR 1.35 0.00 - 1.49  Comprehensive metabolic panel      Status: Abnormal   Collection Time: 01/11/15  3:06 AM  Result Value Ref Range   Sodium 137 135 - 145 mmol/L   Potassium 2.8 (L) 3.5 - 5.1 mmol/L   Chloride 101 101 - 111 mmol/L   CO2 26 22 - 32 mmol/L   Glucose, Bld 90 65 - 99 mg/dL   BUN 15 6 - 20 mg/dL   Creatinine, Ser 4.09 0.44 - 1.00 mg/dL   Calcium 8.3 (L) 8.9 - 10.3 mg/dL   Total Protein 5.4 (L) 6.5 - 8.1 g/dL   Albumin 2.3 (L) 3.5 - 5.0 g/dL   AST 23 15 - 41 U/L   ALT 18 14 - 54 U/L   Alkaline Phosphatase 142 (H) 38 - 126 U/L   Total Bilirubin 1.1 0.3 - 1.2 mg/dL   GFR calc non Af Amer >60 >60 mL/min   GFR calc Af Amer >60 >60 mL/min   Anion gap 10 5 - 15  CBC WITH DIFFERENTIAL     Status: Abnormal   Collection Time: 01/11/15  3:06 AM  Result Value Ref Range   WBC 4.2 4.0 - 10.5 K/uL   RBC 4.29 3.87 - 5.11 MIL/uL   Hemoglobin 11.3 (L) 12.0 - 15.0 g/dL   HCT 81.1 (L) 91.4 - 78.2 %   MCV 82.5 78.0 - 100.0 fL   MCH 26.3 26.0 - 34.0 pg   MCHC 31.9 30.0 - 36.0 g/dL  RDW 16.0 (H) 11.5 - 15.5 %   Platelets 161 150 - 400 K/uL   Neutrophils Relative % 49 %   Neutro Abs 2.1 1.7 - 7.7 K/uL   Lymphocytes Relative 41 %   Lymphs Abs 1.7 0.7 - 4.0 K/uL   Monocytes Relative 8 %   Monocytes Absolute 0.3 0.1 - 1.0 K/uL   Eosinophils Relative 1 %   Eosinophils Absolute 0.1 0.0 - 0.7 K/uL   Basophils Relative 1 %   Basophils Absolute 0.0 0.0 - 0.1 K/uL  Magnesium     Status: None   Collection Time: 01/11/15  3:06 AM  Result Value Ref Range   Magnesium 1.7 1.7 - 2.4 mg/dL  Troponin I     Status: Abnormal   Collection Time: 01/11/15  6:49 AM  Result Value Ref Range   Troponin I 0.10 (H) <0.031 ng/mL    EKG: NSR, poor anterior RW, NS TWI  IMAGING: Dg Chest 2 View  01/10/2015   CLINICAL DATA:  Weakness today.  EXAM: CHEST  2 VIEW  COMPARISON:  February 12, 2010  FINDINGS: The heart size is enlarged. The mediastinal contour is normal. There is consolidation of right lung base with right pleural effusion. There is minimal  left pleural effusion. There is no pulmonary edema. No acute abnormalities identified within the visualized bones.  IMPRESSION: Right lung base pneumonia with right pleural effusion. Minimal left pleural effusion.  Cardiomegaly.   Electronically Signed   By: Sherian Rein M.D.   On: 01/10/2015 22:58    IMPRESSION: Principal Problem:   Acute on chronic systolic CHF (congestive heart failure) Active Problems:   Paranoia   Non compliance with medical treatment   Cardiomyopathy- etiology undetermined   Hyperthyroidism   Bilateral leg edema   Anemia   Chronic back pain   Tobacco abuse   Hypokalemia   Elevated troponin   RECOMMENDATION: MD to see. Difficult situation, not clear she is a candidate for an ischemic work up.  Clearly she has significant psychiatric issues and poor family support. We can treat her in the hospital for CHF and hyperthyroidism (TSH 0.013) but I doubt she will take medications after discharge.   Time Spent Directly with Patient: 45 minutes  Abelino Derrick [phone] beeper 01/11/2015, 12:55 PM    Patient seen and examined with Corine Shelter, PA-C. We discussed all aspects of the encounter. I agree with the assessment and plan as stated above.   She has a severe cardiomyopathy felt to be related to untreated hyperthyroidism but ischemia has not been excluded. She now has marked volume overload. Unfortunately situation is limited by very poor insight on her part or her daughter's and she doesn't seem to understand the need for treatment or further w/u. I do not think we will be able to do much for her. Will increase IV lasix. Continue b-blocker and ACE. Conaider restarting PTU per primary team. Will use spiro in house to help preserve K+ while diuresing but would not discharge on spiro given noncompliance with f/u. Consider Psych evaluation for competency. Would likely benefit from SNF.   Our general cardiology team will follow.    Bensimhon, Daniel,MD 1:33  PM

## 2015-01-11 NOTE — Progress Notes (Signed)
Assisted patient to the beside commode. Patient refused nurse to help clean her after having a bowel movement. Offered more toilet tissue and she agreed to take it. Afterwards patient did not want to take pants off and wanted them on halfway at the knees. Assisted patient to bed and bed alarm was placed for patient is not fully steady on feet. Patient does stated she walks with a cane at home. Family is present. Currently patient complains of no pain or discomfort at this time. Will continue to monitor patient to end of shift.

## 2015-01-11 NOTE — Progress Notes (Signed)
Patient admitted earlier this morning by Dr. Lynden Oxford.  Please see full H&P for details.  79 year old with a history of systolic heart failure, thyroid disease, hypertension presented to the emergency department with complaints of leg swelling.  Patient appears to have some cognitive defects as well. Upon questioning her, she does not have a primary care physician or sees a cardiologist. She has not been taking any medications.  Patient was noted for acute on chronic systolic heart failure. Repeat echocardiogram pending.  Echocardiogram February 2016 showed an EF of 20-25%. BNP upon admission was over 4500. She also has an elevated troponin however remains flat. Cardiology consultation appreciated. We'll continue to diurese patient. Patient also found to have hypokalemia, currently replacing. Case management also consulted for PCP as well as medication assistance.  Time spent: 30 minutes  Tyshauna Finkbiner D.O. [facility] Pager [phone]  If 7PM-7AM, please contact night-coverage [email or web address] Password [email or web address] 01/11/2015, 1:29 PM

## 2015-01-11 NOTE — Progress Notes (Signed)
Low bp noted= 90/69. Patient due to receive lasix, toprol xl and lisinopril. Notified Dr. Catha Gosselin. She said to give only 20 mg of IV lasix and hold am toprol and lisinopril. Patient informed.

## 2015-01-11 NOTE — Consult Note (Signed)
WOC wound consult note Reason for Consult: Consult requested for bilat leg blisters Wound type: Left anterior leg with clear fluid-filed blister; 2.2X2.2cm Right anterior leg with clear fluid-filled blister, .5X.5cm Drainage (amount, consistency, odor) No odor, drainage, or open wounds. Periwound: Generalized edema to bilat legs Dressing procedure/placement/frequency: Foam dressing to protect from further injury and absorb drainage if blister ruptures. Please re-consult if further assistance is needed.  Thank-you,  Cammie Mcgee MSN, RN, CWOCN, [place], CNS [phone]

## 2015-01-11 NOTE — Progress Notes (Addendum)
PT Cancellation Note  Patient Details Name: Julie Charles MRN: [id number] DOB: [birth year]   Cancelled Treatment:    Reason Eval/Treat Not Completed: Other (comment) (pt just received breakfast tray and RN request hold) Returned at 1155 and pt denied any mobility or evaluation at this time. Stating "just don't want to"   Delorse Lek 01/11/2015, 11:16 AM Delaney Meigs, PT [phone]

## 2015-01-11 NOTE — Progress Notes (Signed)
Heart Failure Navigator Consult Note  Presentation: Julie Charles is a 79 y.o. female with Past medical history of chronic systolic heart failure, anemia, thyroid disease, essential hypertension. The patient is presenting with complaints of progressively worsening leg swelling ongoing for last 2 months. Patient also mentions she has been gaining some weight. She thinks that "some of the foreigners around her house are putting some gas out which is causing her to have the leg swelling". She denies having any chest pain or chest tightness. No fever no cough. She feels always cold. She denies having any palpitation. No complaints of nausea vomiting or diarrhea. She denies any active bleeding. She denies any burning urination. She denies any fall trauma or injury. At her baseline she only takes aspirin and does not take any other medications. She does not see any physician on a regular basis.   Past Medical History  Diagnosis Date  . Back pain   . Iron deficiency anemia 10/17/2011  . Allergy   . Arthritis     Social History   Social History  . Marital Status: Married    Spouse Name: N/A  . Number of Children: N/A  . Years of Education: N/A   Social History Main Topics  . Smoking status: Current Every Day Smoker -- 0.50 packs/day    Types: Cigarettes  . Smokeless tobacco: None  . Alcohol Use: No  . Drug Use: No  . Sexual Activity: Not Asked   Other Topics Concern  . None   Social History Narrative   ** Merged History Encounter **        ECHO: pending  BNP    Component Value Date/Time   BNP >4500.0* 01/10/2015 2242    ProBNP No results found for: PROBNP   Education Assessment and Provision:  Detailed education and instructions provided on heart failure disease management including the following:  Signs and symptoms of Heart Failure When to call the physician Importance of daily weights Low sodium diet Fluid restriction Medication management Anticipated future  follow-up appointments  Patient education given on each of the above topics.  Patient acknowledges understanding and acceptance of all instructions.  I spoke briefly with Julie Charles regarding her HF.  She told me that she "does not have HF".  She has "foreigners that live near her that are putting gas into the air" and that is "making her sick".  I reinforced that the doctors believe that she has HF based on her previous echocardiogram and symptoms.  I then attempted to discuss HF recommendations for home.  She does not have a scale and I can provide one for home use.  I discussed briefly the importance of daily weights and how they relate to signs and symptoms of HF.  I reinforced the need for a low sodium diet--she tells me that she "doesn't eat much".  I will plan to return tomorrow and reinforce education.  She and her daughter (present in the room)  seem to lack any insight into her health condition and have very poor health literacy.  There are no notes to indicate that she had follow-up after her last hospitalization with HF.   Education Materials:  "Living Better With Heart Failure" Booklet, Daily Weight Tracker Tool   High Risk Criteria for Readmission and/or Poor Patient Outcomes:   EF <30%- pending  2 or more admissions in 6 months- 1/6  Difficult social situation- yes-lives with son--he works all day and she is alone  Demonstrates medication noncompliance- ?  Barriers of Care:  Knowledge and compliance  Discharge Planning:   Plans to discharge to home at this time with son.  ? Ability to provide self care as he works and is not there most of the day.

## 2015-01-11 NOTE — Progress Notes (Signed)
Pt refused to have echo done earlier this am, "didn't want to be bothered" pt stated per the echo tech.  Echo tech will try back later this afternoon as RN suggested.  Will discuss need for procedure with pt.

## 2015-01-12 DIAGNOSIS — F22 Delusional disorders: Secondary | ICD-10-CM

## 2015-01-12 DIAGNOSIS — R600 Localized edema: Secondary | ICD-10-CM

## 2015-01-12 DIAGNOSIS — I429 Cardiomyopathy, unspecified: Secondary | ICD-10-CM

## 2015-01-12 DIAGNOSIS — F600 Paranoid personality disorder: Secondary | ICD-10-CM

## 2015-01-12 LAB — CBC
HCT: 35.8 % — ABNORMAL LOW (ref 36.0–46.0)
HEMOGLOBIN: 11.5 g/dL — AB (ref 12.0–15.0)
MCH: 26.3 pg (ref 26.0–34.0)
MCHC: 32.1 g/dL (ref 30.0–36.0)
MCV: 81.9 fL (ref 78.0–100.0)
PLATELETS: 169 10*3/uL (ref 150–400)
RBC: 4.37 MIL/uL (ref 3.87–5.11)
RDW: 15.9 % — ABNORMAL HIGH (ref 11.5–15.5)
WBC: 4.9 10*3/uL (ref 4.0–10.5)

## 2015-01-12 LAB — BASIC METABOLIC PANEL
Anion gap: 10 (ref 5–15)
BUN: 14 mg/dL (ref 6–20)
CHLORIDE: 105 mmol/L (ref 101–111)
CO2: 25 mmol/L (ref 22–32)
CREATININE: 0.89 mg/dL (ref 0.44–1.00)
Calcium: 8.8 mg/dL — ABNORMAL LOW (ref 8.9–10.3)
Glucose, Bld: 76 mg/dL (ref 65–99)
POTASSIUM: 3.9 mmol/L (ref 3.5–5.1)
SODIUM: 140 mmol/L (ref 135–145)

## 2015-01-12 MED ORDER — METHIMAZOLE 5 MG PO TABS
5.0000 mg | ORAL_TABLET | Freq: Two times a day (BID) | ORAL | Status: DC
  Administered 2015-01-12 – 2015-01-14 (×6): 5 mg via ORAL
  Filled 2015-01-12: qty 0.5
  Filled 2015-01-12 (×5): qty 1
  Filled 2015-01-12 (×2): qty 0.5

## 2015-01-12 NOTE — Progress Notes (Signed)
Family conference with pt/ son [name] stated that he has been trying to get her to take her medication and see a Doctor but she would not go.  Stated " I cant take her to the Doctors office kicking and screaming," CM informed him that additional support is needed in the home to assist in her care. Also her statements about people putting poison in her water. Patient son stated that she watches a lot of television .  Plan of Care Julie Charles stated that he would have his other sister Julie Charles move into the home to assist in her care and to encourage her to take her medication and keep her Doctors apt. Julie Charles does not want any HHC at this time because " she will not allow anyone in the home to tell her anything, you see how she is acting now." He is agreeable for her to go to the [facility] for follow up care. Patient got upset with the son and kicked him out of the room. CM will continue to follow for DCP. Abelino Derrick [facility] [phone]

## 2015-01-12 NOTE — Evaluation (Signed)
Physical Therapy Evaluation Patient Details Name: Julie Charles MRN: [id number] DOB: 04-Dec-1935 Today's Date: 01/12/2015   History of Present Illness  Julie Charles is a 79 y.o. female with Past medical history of chronic systolic heart failure, anemia, thyroid disease, essential hypertension.  Admitted with 2 month history of LE swelling.  Clinical Impression  Patient presents with decreased safety with mobility due to SOB and poor safety awareness.  Feel would need 24 hour assist for safety though not as much due to mobility as due to safety awareness.  Patient will benefit from skilled PT in the acute setting to maximize mobility, independence and safety.     Follow Up Recommendations Supervision/Assistance - 24 hour;SNF;Home health PT (depending on 24 hour assist)    Equipment Recommendations  Other (comment) (TBA)    Recommendations for Other Services       Precautions / Restrictions Precautions Precautions: Fall Restrictions Weight Bearing Restrictions: No      Mobility  Bed Mobility               General bed mobility comments: NT, pt sitting on edge of bed  Transfers Overall transfer level: Modified independent                  Ambulation/Gait Ambulation/Gait assistance: Min guard;Supervision Ambulation Distance (Feet): 12 Feet (x 2) Assistive device: None Gait Pattern/deviations: Step-through pattern;Trunk flexed;Decreased stride length     General Gait Details: assist for safety keepijng gown from getting under her feet.  holding sink on the way past, SOB with ambulation in room  Stairs            Wheelchair Mobility    Modified Rankin (Stroke Patients Only)       Balance Overall balance assessment: Needs assistance             Standing balance comment: mild imbalance apparent with flexed posture, and risky behaviors allowing gown to drag and keepng pants below hips unfastened.                                Pertinent Vitals/Pain Pain Assessment: No/denies pain    Home Living Family/patient expects to be discharged to:: Private residence Living Arrangements: Children (son) Available Help at Discharge: Available PRN/intermittently Type of Home: House Home Access: Stairs to enter Entrance Stairs-Rails: Doctor, general practice of Steps: 3 Home Layout: One level Home Equipment: None      Prior Function Level of Independence: Needs assistance   Gait / Transfers Assistance Needed: reports furniture walks at home  ADL's / Homemaking Assistance Needed: son assists with meals, groceries        Hand Dominance        Extremity/Trunk Assessment   Upper Extremity Assessment: Overall WFL for tasks assessed           Lower Extremity Assessment: Generalized weakness         Communication   Communication: No difficulties  Cognition Arousal/Alertness: Awake/alert Behavior During Therapy: Agitated Overall Cognitive Status: No family/caregiver present to determine baseline cognitive functioning (daughter here, but not able to give information)                      General Comments General comments (skin integrity, edema, etc.): daughter in the room but not attempting to engage; patient with multiple complaints related to her care as well as confabulating about staff putting "nasty stuff" all over her house  Exercises        Assessment/Plan    PT Assessment Patient needs continued PT services  PT Diagnosis Abnormality of gait;Generalized weakness   PT Problem List Decreased strength;Decreased activity tolerance;Decreased mobility;Decreased safety awareness  PT Treatment Interventions DME instruction;Balance training;Gait training;Functional mobility training;Patient/family education;Therapeutic activities;Therapeutic exercise;Stair training   PT Goals (Current goals can be found in the Care Plan section) Acute Rehab PT Goals Patient Stated Goal: To  go home PT Goal Formulation: Patient unable to participate in goal setting Time For Goal Achievement: 01/26/15 Potential to Achieve Goals: Fair    Frequency Min 3X/week   Barriers to discharge Decreased caregiver support son works    Co-evaluation               End of Session   Activity Tolerance: Patient limited by fatigue (SOB) Patient left: in bed;with call bell/phone within reach;with family/visitor present;Other (comment) (MD in room Psychiatry)           Time: 1610-9604 PT Time Calculation (min) (ACUTE ONLY): 26 min   Charges:   PT Evaluation $Initial PT Evaluation Tier I: 1 Procedure PT Treatments $Therapeutic Activity: 8-22 mins   PT G Codes:        WYNN,CYNDI 2015/02/11, 11:48 AM  Sheran Lawless, PT [phone] 02-11-15

## 2015-01-12 NOTE — Progress Notes (Signed)
Subjective:  Admitted for BLE edema. Psych issues per Dr Haroldine Laws, diuresing  Objective:  Temp:  [97.4 F (36.3 C)-98.2 F (36.8 C)] 97.4 F (36.3 C) (09/28 0516) Pulse Rate:  [88-100] 88 (09/28 1137) Resp:  [16-22] 22 (09/28 1137) BP: (105-126)/(74-94) 116/94 mmHg (09/28 0516) SpO2:  [89 %-100 %] 89 % (09/28 1137) Weight:  [130 lb 15.3 oz (59.4 kg)] 130 lb 15.3 oz (59.4 kg) (09/28 0516) Weight change: -3 lb 2.4 oz (-1.427 kg)  Intake/Output from previous day: 09/27 0701 - 09/28 0700 In: 903 [P.O.:600; I.V.:3; IV Piggyback:300] Out: 1402 [Urine:1400; Stool:2]  Intake/Output from this shift: Total I/O In: -  Out: 300 [Urine:300]  Physical Exam: General appearance: alert and slowed mentation Neck: no adenopathy, no carotid bruit, no JVD, supple, symmetrical, trachea midline and thyroid not enlarged, symmetric, no tenderness/mass/nodules Lungs: clear to auscultation bilaterally Heart: regular rate and rhythm, S1, S2 normal, no murmur, click, rub or gallop Extremities: 2+ PITTING EDEMA BILAT.  Lab Results: Results for orders placed or performed during the hospital encounter of 01/10/15 (from the past 48 hour(s))  Comprehensive metabolic panel     Status: Abnormal   Collection Time: 01/10/15 10:41 PM  Result Value Ref Range   Sodium 137 135 - 145 mmol/L   Potassium 3.0 (L) 3.5 - 5.1 mmol/L   Chloride 103 101 - 111 mmol/L   CO2 26 22 - 32 mmol/L   Glucose, Bld 96 65 - 99 mg/dL   BUN 15 6 - 20 mg/dL   Creatinine, Ser 0.76 0.44 - 1.00 mg/dL   Calcium 8.4 (L) 8.9 - 10.3 mg/dL   Total Protein 5.4 (L) 6.5 - 8.1 g/dL   Albumin 2.3 (L) 3.5 - 5.0 g/dL   AST 25 15 - 41 U/L   ALT 19 14 - 54 U/L   Alkaline Phosphatase 146 (H) 38 - 126 U/L   Total Bilirubin 1.2 0.3 - 1.2 mg/dL   GFR calc non Af Amer >60 >60 mL/min   GFR calc Af Amer >60 >60 mL/min    Comment: (NOTE) The eGFR has been calculated using the CKD EPI equation. This calculation has not been validated in all  clinical situations. eGFR's persistently <60 mL/min signify possible Chronic Kidney Disease.    Anion gap 8 5 - 15  CBC with Differential     Status: Abnormal   Collection Time: 01/10/15 10:41 PM  Result Value Ref Range   WBC 4.8 4.0 - 10.5 K/uL   RBC 4.51 3.87 - 5.11 MIL/uL   Hemoglobin 11.8 (L) 12.0 - 15.0 g/dL   HCT 37.7 36.0 - 46.0 %   MCV 83.6 78.0 - 100.0 fL   MCH 26.2 26.0 - 34.0 pg   MCHC 31.3 30.0 - 36.0 g/dL   RDW 16.1 (H) 11.5 - 15.5 %   Platelets 153 150 - 400 K/uL    Comment: REPEATED TO VERIFY PLATELET COUNT CONFIRMED BY SMEAR    Neutrophils Relative % 42 %   Lymphocytes Relative 49 %   Monocytes Relative 7 %   Eosinophils Relative 1 %   Basophils Relative 1 %   Neutro Abs 2.0 1.7 - 7.7 K/uL   Lymphs Abs 2.5 0.7 - 4.0 K/uL   Monocytes Absolute 0.3 0.1 - 1.0 K/uL   Eosinophils Absolute 0.0 0.0 - 0.7 K/uL   Basophils Absolute 0.0 0.0 - 0.1 K/uL   RBC Morphology POLYCHROMASIA PRESENT     Comment: ELLIPTOCYTES BURR CELLS TARGET CELLS  Brain natriuretic peptide     Status: Abnormal   Collection Time: 01/10/15 10:42 PM  Result Value Ref Range   B Natriuretic Peptide >4500.0 (H) 0.0 - 100.0 pg/mL  I-Stat Troponin, ED (not at [facility])     Status: Abnormal   Collection Time: 01/10/15 10:48 PM  Result Value Ref Range   Troponin i, poc 0.26 (HH) 0.00 - 0.08 ng/mL   Comment NOTIFIED PHYSICIAN    Comment 3            Comment: Due to the release kinetics of cTnI, a negative result within the first hours of the onset of symptoms does not rule out myocardial infarction with certainty. If myocardial infarction is still suspected, repeat the test at appropriate intervals.   Troponin I     Status: Abnormal   Collection Time: 01/10/15 11:14 PM  Result Value Ref Range   Troponin I 0.11 (H) <0.031 ng/mL    Comment:        PERSISTENTLY INCREASED TROPONIN VALUES IN THE RANGE OF 0.04-0.49 ng/mL CAN BE SEEN IN:       -UNSTABLE ANGINA       -CONGESTIVE HEART FAILURE        -MYOCARDITIS       -CHEST TRAUMA       -ARRYHTHMIAS       -LATE PRESENTING MYOCARDIAL INFARCTION       -COPD   CLINICAL FOLLOW-UP RECOMMENDED.   Troponin I     Status: Abnormal   Collection Time: 01/11/15  3:06 AM  Result Value Ref Range   Troponin I 0.09 (H) <0.031 ng/mL    Comment:        PERSISTENTLY INCREASED TROPONIN VALUES IN THE RANGE OF 0.04-0.49 ng/mL CAN BE SEEN IN:       -UNSTABLE ANGINA       -CONGESTIVE HEART FAILURE       -MYOCARDITIS       -CHEST TRAUMA       -ARRYHTHMIAS       -LATE PRESENTING MYOCARDIAL INFARCTION       -COPD   CLINICAL FOLLOW-UP RECOMMENDED.   TSH     Status: Abnormal   Collection Time: 01/11/15  3:06 AM  Result Value Ref Range   TSH 0.013 (L) 0.350 - 4.500 uIU/mL  Protime-INR     Status: Abnormal   Collection Time: 01/11/15  3:06 AM  Result Value Ref Range   Prothrombin Time 16.8 (H) 11.6 - 15.2 seconds   INR 1.35 0.00 - 1.49  Comprehensive metabolic panel     Status: Abnormal   Collection Time: 01/11/15  3:06 AM  Result Value Ref Range   Sodium 137 135 - 145 mmol/L   Potassium 2.8 (L) 3.5 - 5.1 mmol/L   Chloride 101 101 - 111 mmol/L   CO2 26 22 - 32 mmol/L   Glucose, Bld 90 65 - 99 mg/dL   BUN 15 6 - 20 mg/dL   Creatinine, Ser 0.77 0.44 - 1.00 mg/dL   Calcium 8.3 (L) 8.9 - 10.3 mg/dL   Total Protein 5.4 (L) 6.5 - 8.1 g/dL   Albumin 2.3 (L) 3.5 - 5.0 g/dL   AST 23 15 - 41 U/L   ALT 18 14 - 54 U/L   Alkaline Phosphatase 142 (H) 38 - 126 U/L   Total Bilirubin 1.1 0.3 - 1.2 mg/dL   GFR calc non Af Amer >60 >60 mL/min   GFR calc Af Amer >60 >60 mL/min    Comment: (  NOTE) The eGFR has been calculated using the CKD EPI equation. This calculation has not been validated in all clinical situations. eGFR's persistently <60 mL/min signify possible Chronic Kidney Disease.    Anion gap 10 5 - 15  CBC WITH DIFFERENTIAL     Status: Abnormal   Collection Time: 01/11/15  3:06 AM  Result Value Ref Range   WBC 4.2 4.0 - 10.5 K/uL   RBC  4.29 3.87 - 5.11 MIL/uL   Hemoglobin 11.3 (L) 12.0 - 15.0 g/dL   HCT 35.4 (L) 36.0 - 46.0 %   MCV 82.5 78.0 - 100.0 fL   MCH 26.3 26.0 - 34.0 pg   MCHC 31.9 30.0 - 36.0 g/dL   RDW 16.0 (H) 11.5 - 15.5 %   Platelets 161 150 - 400 K/uL   Neutrophils Relative % 49 %   Neutro Abs 2.1 1.7 - 7.7 K/uL   Lymphocytes Relative 41 %   Lymphs Abs 1.7 0.7 - 4.0 K/uL   Monocytes Relative 8 %   Monocytes Absolute 0.3 0.1 - 1.0 K/uL   Eosinophils Relative 1 %   Eosinophils Absolute 0.1 0.0 - 0.7 K/uL   Basophils Relative 1 %   Basophils Absolute 0.0 0.0 - 0.1 K/uL  Magnesium     Status: None   Collection Time: 01/11/15  3:06 AM  Result Value Ref Range   Magnesium 1.7 1.7 - 2.4 mg/dL  Troponin I     Status: Abnormal   Collection Time: 01/11/15  6:49 AM  Result Value Ref Range   Troponin I 0.10 (H) <0.031 ng/mL    Comment:        PERSISTENTLY INCREASED TROPONIN VALUES IN THE RANGE OF 0.04-0.49 ng/mL CAN BE SEEN IN:       -UNSTABLE ANGINA       -CONGESTIVE HEART FAILURE       -MYOCARDITIS       -CHEST TRAUMA       -ARRYHTHMIAS       -LATE PRESENTING MYOCARDIAL INFARCTION       -COPD   CLINICAL FOLLOW-UP RECOMMENDED.   Troponin I     Status: Abnormal   Collection Time: 01/11/15  3:28 PM  Result Value Ref Range   Troponin I 0.12 (H) <0.031 ng/mL    Comment:        PERSISTENTLY INCREASED TROPONIN VALUES IN THE RANGE OF 0.04-0.49 ng/mL CAN BE SEEN IN:       -UNSTABLE ANGINA       -CONGESTIVE HEART FAILURE       -MYOCARDITIS       -CHEST TRAUMA       -ARRYHTHMIAS       -LATE PRESENTING MYOCARDIAL INFARCTION       -COPD   CLINICAL FOLLOW-UP RECOMMENDED.   T4, free     Status: Abnormal   Collection Time: 01/11/15  3:28 PM  Result Value Ref Range   Free T4 4.06 (H) 0.61 - 1.12 ng/dL  CBC     Status: Abnormal   Collection Time: 01/12/15  4:47 AM  Result Value Ref Range   WBC 4.9 4.0 - 10.5 K/uL   RBC 4.37 3.87 - 5.11 MIL/uL   Hemoglobin 11.5 (L) 12.0 - 15.0 g/dL   HCT 35.8 (L)  36.0 - 46.0 %   MCV 81.9 78.0 - 100.0 fL   MCH 26.3 26.0 - 34.0 pg   MCHC 32.1 30.0 - 36.0 g/dL   RDW 15.9 (H) 11.5 - 15.5 %  Platelets 169 150 - 400 K/uL  Basic metabolic panel     Status: Abnormal   Collection Time: 01/12/15  4:47 AM  Result Value Ref Range   Sodium 140 135 - 145 mmol/L   Potassium 3.9 3.5 - 5.1 mmol/L   Chloride 105 101 - 111 mmol/L   CO2 25 22 - 32 mmol/L   Glucose, Bld 76 65 - 99 mg/dL   BUN 14 6 - 20 mg/dL   Creatinine, Ser 0.89 0.44 - 1.00 mg/dL   Calcium 8.8 (L) 8.9 - 10.3 mg/dL   GFR calc non Af Amer >60 >60 mL/min   GFR calc Af Amer >60 >60 mL/min    Comment: (NOTE) The eGFR has been calculated using the CKD EPI equation. This calculation has not been validated in all clinical situations. eGFR's persistently <60 mL/min signify possible Chronic Kidney Disease.    Anion gap 10 5 - 15    Imaging: Imaging results have been reviewed. rRight pleural effusion with consolidation  Assessment/Plan:   1. Principal Problem: 2.   Paranoia 3. Active Problems: 4.   Anemia 5.   Chronic back pain 6.   Bilateral leg edema 7.   Tobacco abuse 8.   Acute on chronic systolic CHF (congestive heart failure) 9.   Hypokalemia 10.   Elevated troponin 11.   Non compliance with medical treatment 12.   Cardiomyopathy- etiology undetermined 13.   Hyperthyroidism 14.   Time Spent Directly with Patient:  20 minutes  Length of Stay:  LOS: 1 day   Diuresing on IV lasix and spironolactone. Still has 2+ pitting edema. 2D shows EF 15%. Agree with exploring SNF since I'm skeptical she will comply with Rx regimen at home. Psych evalQuay Burow 01/12/2015, 12:36 PM

## 2015-01-12 NOTE — Progress Notes (Signed)
I visited patient again and delivered the scale.  I again reiterated the need for daily weights and how they relate to signs and symptoms of HF.  She insists that her problem is the "gas being pumped into the air" where she lives.  I discussed case further with Jiles Crocker -Case Manager.  I am concerned about the patient's lack of insight into her condition and ability to provide self-care.  Steward Drone has been in contact with patient's son (with whom she lives) and is planning to discuss our concerns with him this afternoon when he comes to visit his mother.  I will continue to support as needed and will call the son if needed to provide further HF education with him.

## 2015-01-12 NOTE — Progress Notes (Signed)
PROGRESS NOTE  Julie Charles ZOX:[id number] DOB: 03-09-1936 DOA: 01/10/2015 PCP: No PCP Per Patient Brief history 79 year old female with a history of systolic CHF, hypertension, hyperthyroidism, cognitive impairment, and iron deficiency anemia presented with worsening leg edema and shortness of breath. The patient is a very difficult and poor historian. Unfortunately, her daughter is not able to contribute any history. Workup revealed fluid overload and decompensated CHF. The patient was started on furosemide with good clinical effect. Assessment/Plan: Acute on chronic systolic CHF -01/11/2015 Echo--EF 15%, diffuse HK -Remains clinically volume overloaded -Continue intravenous furosemide 40 mg twice a day -Dry weight approx 120 -continue I/O and daily weights -continue metoprolol succinate and lisinopril and aldactone -pt wanted to leave hospital AMA today, spoke with son to convince her to stay -Suspect underlying cardiomyopathy secondary to untreated hypothyroidism but cannot exclude ischemia -Appreciate cardiology -pt with very poor insight, poor health literacy -psychiatry for capacity evaluation Hyperthyroidism -suspect Grave's disease -thyroid ultrasound -start methimazole -check anti-TPO antibodies Hypokalemia -replete Elevated tropoinin -due to decompensated CHF -denies chest pain Iron deficiency anemia -recheck iron studies -doubt compliance with meds    Family Communication:   Son updated on phone Disposition Plan:   Home when medically stable    Procedures/Studies: Dg Chest 2 View  01/10/2015   CLINICAL DATA:  Weakness today.  EXAM: CHEST  2 VIEW  COMPARISON:  February 12, 2010  FINDINGS: The heart size is enlarged. The mediastinal contour is normal. There is consolidation of right lung base with right pleural effusion. There is minimal left pleural effusion. There is no pulmonary edema. No acute abnormalities identified within the visualized bones.   IMPRESSION: Right lung base pneumonia with right pleural effusion. Minimal left pleural effusion.  Cardiomegaly.   Electronically Signed   By: Sherian Rein M.D.   On: 01/10/2015 22:58         Subjective: Patient denies fevers, chills, headache, chest pain, dyspnea, nausea, vomiting, diarrhea, abdominal pain, dysuria, hematuria   Objective: Filed Vitals:   01/11/15 1445 01/11/15 1804 01/11/15 2038 01/12/15 0516  BP: 105/75 113/74 126/87 116/94  Pulse: 88 90 100 96  Temp: 98.2 F (36.8 C)  98.1 F (36.7 C) 97.4 F (36.3 C)  TempSrc: Oral  Oral Oral  Resp:   16 16  Height:      Weight:    59.4 kg (130 lb 15.3 oz)  SpO2: 99%  100% 100%    Intake/Output Summary (Last 24 hours) at 01/12/15 1036 Last data filed at 01/12/15 0807  Gross per 24 hour  Intake    803 ml  Output   1702 ml  Net   -899 ml   Weight change: -1.427 kg (-3 lb 2.4 oz) Exam:   General:  Pt is alert, follows commands appropriately, not in acute distress  HEENT: No icterus, No thrush, No neck mass, [place]/AT  Cardiovascular: RRR, S1/S2, no rubs, no gallops  Respiratory: Bibasilar crackles, left greater than right. No wheezing  Abdomen: Soft/+BS, non tender, non distended, no guarding; no hepatosplenomegaly  Extremities: 1+LE edema, No lymphangitis, No petechiae, No rashes, no synovitis; no cyanosis or clubbing  Data Reviewed: Basic Metabolic Panel:  Recent Labs Lab 01/10/15 2241 01/11/15 0306 01/12/15 0447  NA 137 137 140  K 3.0* 2.8* 3.9  CL 103 101 105  CO2 GLUCOSE 96 90 76  BUN CREATININE 0.76 0.77 0.89  CALCIUM 8.4* 8.3* 8.8*  MG  --  1.7  --    Liver Function Tests:  Recent Labs Lab 01/10/15 2241 01/11/15 0306  AST 25 23  ALT 19 18  ALKPHOS 146* 142*  BILITOT 1.2 1.1  PROT 5.4* 5.4*  ALBUMIN 2.3* 2.3*   No results for input(s): LIPASE, AMYLASE in the last 168 hours. No results for input(s): AMMONIA in the last 168 hours. CBC:  Recent Labs Lab  01/10/15 2241 01/11/15 0306 01/12/15 0447  WBC 4.8 4.2 4.9  NEUTROABS 2.0 2.1  --   HGB 11.8* 11.3* 11.5*  HCT 37.7 35.4* 35.8*  MCV 83.6 82.5 81.9  PLT 153 161 169   Cardiac Enzymes:  Recent Labs Lab 01/10/15 2314 01/11/15 0306 01/11/15 0649 01/11/15 1528  TROPONINI 0.11* 0.09* 0.10* 0.12*   BNP: Invalid input(s): POCBNP CBG: No results for input(s): GLUCAP in the last 168 hours.  No results found for this or any previous visit (from the past 240 hour(s)).   Scheduled Meds: . aspirin EC  81 mg Oral Daily  . enoxaparin (LOVENOX) injection  40 mg Subcutaneous Q24H  . furosemide  40 mg Intravenous BID  . Influenza vac split quadrivalent PF  0.5 mL Intramuscular Tomorrow-1000  . lisinopril  2.5 mg Oral Daily  . methimazole  5 mg Oral BID  . metoprolol succinate  25 mg Oral Daily  . sodium chloride  3 mL Intravenous Q12H  . spironolactone  25 mg Oral Daily   Continuous Infusions:    Ryle Buscemi, DO  [facility] Pager [phone]  If 7PM-7AM, please contact night-coverage [email or web address] Password [email or web address] 01/12/2015, 10:36 AM   LOS: 1 day

## 2015-01-12 NOTE — Progress Notes (Signed)
Pt refused USG , refused to take vitals and refused some of the medicines, repeating that I am going home, leave me alone.

## 2015-01-12 NOTE — Evaluation (Signed)
Occupational Therapy Evaluation Patient Details Name: Tamiki Kuba MRN: [id number] DOB: 19-Jan-1936 Today's Date: 01/12/2015    History of Present Illness Alicyn Klann is a 79 y.o. female with Past medical history of chronic systolic heart failure, anemia, thyroid disease, essential hypertension.  Admitted with 2 month history of LE swelling.   Clinical Impression   Pt admitted with above and present with decreased safety with mobility due to SOB and poor safety awareness.  Pt refused to leave EOB but willing to complete simulated dressing tasks at sit > stand level.  Educated pt on energy conservation strategies with self-care tasks.  Feel pt would benefit from 24 hour assist for safety due to impaired safety awareness.  Pt declined further OT intervention in the acute setting, but appreciative of recommendations.      Follow Up Recommendations  Supervision/Assistance - 24 hour    Equipment Recommendations  None recommended by OT    Recommendations for Other Services       Precautions / Restrictions Precautions Precautions: Fall Restrictions Weight Bearing Restrictions: No      Mobility Bed Mobility               General bed mobility comments: not tested, pt sitting on edge of bed  Transfers Overall transfer level: Modified independent                    Balance Overall balance assessment: Needs assistance             Standing balance comment: mild imbalance apparent with flexed posture, and risky behaviors allowing gown to drag and keepng pants below hips unfastened.                              ADL Overall ADL's : At baseline                                       General ADL Comments: Pt demonstrated ability to doff/don socks and pull up pants in standing     Vision Vision Assessment?: No apparent visual deficits          Pertinent Vitals/Pain Pain Assessment: No/denies pain     Hand Dominance Right    Extremity/Trunk Assessment Upper Extremity Assessment Upper Extremity Assessment: Overall WFL for tasks assessed   Lower Extremity Assessment Lower Extremity Assessment: Generalized weakness       Communication Communication Communication: No difficulties   Cognition Arousal/Alertness: Awake/alert Behavior During Therapy: Agitated Overall Cognitive Status: No family/caregiver present to determine baseline cognitive functioning (daughter present, but very little interaction)                                Home Living Family/patient expects to be discharged to:: Private residence Living Arrangements: Children (son) Available Help at Discharge: Available 24 hours/day (pt's daughter reports son can provide 24/7 supervision) Type of Home: House Home Access: Stairs to enter Secretary/administrator of Steps: 3 Entrance Stairs-Rails: Right;Left Home Layout: One level     Bathroom Shower/Tub: Tub/shower unit (however pt reports sponge bathing seated at sink)   Firefighter: Standard     Home Equipment: Cane - single point          Prior Functioning/Environment Level of Independence: Needs assistance  Gait / Transfers Assistance Needed: reports  furniture walks at home ADL's / Homemaking Assistance Needed: son assists with meals, groceries        OT Diagnosis: Generalized weakness;Altered mental status         OT Goals(Current goals can be found in the care plan section) Acute Rehab OT Goals Patient Stated Goal: To go home OT Goal Formulation: All assessment and education complete, DC therapy Time For Goal Achievement: 02/12/2015 Potential to Achieve Goals: Good  OT Frequency:      End of Session    Activity Tolerance: No increased pain;Treatment limited secondary to agitation Patient left: in bed;with call bell/phone within reach;with bed alarm set   Time: [id number] OT Time Calculation (min): 12 min Charges:  OT General Charges $OT Visit: 1  Procedure G-CodesRosalio Loud, [id number] 01/12/2015, 1:43 PM

## 2015-01-12 NOTE — Consult Note (Signed)
[facility] Face-to-Face Psychiatry Consult   Reason for Consult:  Capacity evaluation and paranoia Referring Physician:  Dr. Carles Collet Patient Identification: Julie Charles MRN:  [id number] Principal Diagnosis: Paranoia Diagnosis:   Patient Active Problem List   Diagnosis Date Noted  . Acute on chronic systolic CHF (congestive heart failure) [I50.23] 01/11/2015  . Hypokalemia [E87.6] 01/11/2015  . Elevated troponin [R79.89] 01/11/2015  . Paranoia [F22] 01/11/2015  . Non compliance with medical treatment [Z91.19] 01/11/2015  . Cardiomyopathy- etiology undetermined [I42.9] 01/11/2015  . Hyperthyroidism [E05.90] 01/11/2015  . CHF (congestive heart failure) [I50.9] 06/11/2014  . Bilateral leg edema [R60.0] 06/11/2014  . Shortness of breath [R06.02] 06/11/2014  . Abdominal pain [R10.9] 06/11/2014  . Tobacco abuse [Z72.0] 06/11/2014  . Unspecified diastolic heart failure [W09.81]   . Iron deficiency anemia [D50.9] 10/17/2011  . Paranoia (psychosis) [F22] 10/15/2011  . Anemia [D64.9] 10/15/2011  . Elevated blood pressure [R03.0] 10/15/2011  . Chronic back pain [M54.9, G89.29] 10/15/2011    Total Time spent with patient: 1 hour  Subjective:   Julie Charles is a 79 y.o. female patient admitted with leg swelling, paranoia and non compliance.  HPI:  Julie Charles is a 79 y.o. female seen, chart reviewed for psychiatric consultation and evaluation of paranoid delusions. Patient stated that she has been seeing 2 young foreighers around her house messing up with gas and air condition which may have caused her leg swollen. Patient stated she has same visual hallucinations over one and half years and she stated she never called authorities or [employer]. Patient son stated that patient has imagination of people walking around her but never acted on them. Patient was admitted to [facility] with the lower extremity swellings, noncompliant with medications  and congestive heart failure. Patient has  prolonged QTC and may not able to tolerate and the psychotic medication for paranoid delusions. Patient is also have the same delusional thoughts more than one and half year. Patient lives with her son but does not have safety concerns to herself and other people. Patient denied any disturbance of sleep and appetite. Patient stated she does not want to gain weight or lose weight. 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 Patient and her daughter stated she need neosporin on her leg. Patient has intact cognitions which indicated she does not have any dementia or delirium at this time.   Past Psychiatric History: None reported  Risk to Self: Is patient at risk for suicide?: No Risk to Others:   Prior Inpatient Therapy:   Prior Outpatient Therapy:    Past Medical History:  Past Medical History  Diagnosis Date  . Back pain   . Iron deficiency anemia 10/17/2011  . Allergy   . Arthritis     Past Surgical History  Procedure Laterality Date  . Inner ear surgery     Family History: History reviewed. No pertinent family history. Family Psychiatric  History not significant for psychiatric conditions or acute inpatient psychiatric hospitalization. Social History:  History  Alcohol Use No     History  Drug Use No    Social History   Social History  . Marital Status: Married    Spouse Name: N/A  . Number of Children: N/A  . Years of Education: N/A   Social History Main Topics  . Smoking status: Current Every Day Smoker -- 0.50 packs/day    Types: Cigarettes  .  Smokeless tobacco: None  . Alcohol Use: No  . Drug Use: No  . Sexual Activity: Not Asked   Other Topics Concern  . None   Social History Narrative   ** Merged History Encounter **       Additional Social History: patient lives with her son. Her son has been working during daytime and she stays by herself at home. she has a  daughter who is supportive to her and visit her more frequently. Patient reported she has a grandson who was imprisoned for aggression.                           Allergies:   Allergies  Allergen Reactions  . Penicillins     Unknown reaction  . Penicillins Rash    Labs:  Results for orders placed or performed during the hospital encounter of 01/10/15 (from the past 48 hour(s))  Comprehensive metabolic panel     Status: Abnormal   Collection Time: 01/10/15 10:41 PM  Result Value Ref Range   Sodium 137 135 - 145 mmol/L   Potassium 3.0 (L) 3.5 - 5.1 mmol/L   Chloride 103 101 - 111 mmol/L   CO2 26 22 - 32 mmol/L   Glucose, Bld 96 65 - 99 mg/dL   BUN 15 6 - 20 mg/dL   Creatinine, Ser 0.76 0.44 - 1.00 mg/dL   Calcium 8.4 (L) 8.9 - 10.3 mg/dL   Total Protein 5.4 (L) 6.5 - 8.1 g/dL   Albumin 2.3 (L) 3.5 - 5.0 g/dL   AST 25 15 - 41 U/L   ALT 19 14 - 54 U/L   Alkaline Phosphatase 146 (H) 38 - 126 U/L   Total Bilirubin 1.2 0.3 - 1.2 mg/dL   GFR calc non Af Amer >60 >60 mL/min   GFR calc Af Amer >60 >60 mL/min    Comment: (NOTE) The eGFR has been calculated using the CKD EPI equation. This calculation has not been validated in all clinical situations. eGFR's persistently <60 mL/min signify possible Chronic Kidney Disease.    Anion gap 8 5 - 15  CBC with Differential     Status: Abnormal   Collection Time: 01/10/15 10:41 PM  Result Value Ref Range   WBC 4.8 4.0 - 10.5 K/uL   RBC 4.51 3.87 - 5.11 MIL/uL   Hemoglobin 11.8 (L) 12.0 - 15.0 g/dL   HCT 37.7 36.0 - 46.0 %   MCV 83.6 78.0 - 100.0 fL   MCH 26.2 26.0 - 34.0 pg   MCHC 31.3 30.0 - 36.0 g/dL   RDW 16.1 (H) 11.5 - 15.5 %   Platelets 153 150 - 400 K/uL    Comment: REPEATED TO VERIFY PLATELET COUNT CONFIRMED BY SMEAR    Neutrophils Relative % 42 %   Lymphocytes Relative 49 %   Monocytes Relative 7 %   Eosinophils Relative 1 %   Basophils Relative 1 %   Neutro Abs 2.0 1.7 - 7.7 K/uL   Lymphs Abs 2.5 0.7 - 4.0  K/uL   Monocytes Absolute 0.3 0.1 - 1.0 K/uL   Eosinophils Absolute 0.0 0.0 - 0.7 K/uL   Basophils Absolute 0.0 0.0 - 0.1 K/uL   RBC Morphology POLYCHROMASIA PRESENT     Comment: ELLIPTOCYTES BURR CELLS TARGET CELLS   Brain natriuretic peptide     Status: Abnormal   Collection Time: 01/10/15 10:42 PM  Result Value Ref Range   B Natriuretic Peptide >4500.0 (H) 0.0 -  100.0 pg/mL  I-Stat Troponin, ED (not at [facility])     Status: Abnormal   Collection Time: 01/10/15 10:48 PM  Result Value Ref Range   Troponin i, poc 0.26 (HH) 0.00 - 0.08 ng/mL   Comment NOTIFIED PHYSICIAN    Comment 3            Comment: Due to the release kinetics of cTnI, a negative result within the first hours of the onset of symptoms does not rule out myocardial infarction with certainty. If myocardial infarction is still suspected, repeat the test at appropriate intervals.   Troponin I     Status: Abnormal   Collection Time: 01/10/15 11:14 PM  Result Value Ref Range   Troponin I 0.11 (H) <0.031 ng/mL    Comment:        PERSISTENTLY INCREASED TROPONIN VALUES IN THE RANGE OF 0.04-0.49 ng/mL CAN BE SEEN IN:       -UNSTABLE ANGINA       -CONGESTIVE HEART FAILURE       -MYOCARDITIS       -CHEST TRAUMA       -ARRYHTHMIAS       -LATE PRESENTING MYOCARDIAL INFARCTION       -COPD   CLINICAL FOLLOW-UP RECOMMENDED.   Troponin I     Status: Abnormal   Collection Time: 01/11/15  3:06 AM  Result Value Ref Range   Troponin I 0.09 (H) <0.031 ng/mL    Comment:        PERSISTENTLY INCREASED TROPONIN VALUES IN THE RANGE OF 0.04-0.49 ng/mL CAN BE SEEN IN:       -UNSTABLE ANGINA       -CONGESTIVE HEART FAILURE       -MYOCARDITIS       -CHEST TRAUMA       -ARRYHTHMIAS       -LATE PRESENTING MYOCARDIAL INFARCTION       -COPD   CLINICAL FOLLOW-UP RECOMMENDED.   TSH     Status: Abnormal   Collection Time: 01/11/15  3:06 AM  Result Value Ref Range   TSH 0.013 (L) 0.350 - 4.500 uIU/mL  Protime-INR     Status:  Abnormal   Collection Time: 01/11/15  3:06 AM  Result Value Ref Range   Prothrombin Time 16.8 (H) 11.6 - 15.2 seconds   INR 1.35 0.00 - 1.49  Comprehensive metabolic panel     Status: Abnormal   Collection Time: 01/11/15  3:06 AM  Result Value Ref Range   Sodium 137 135 - 145 mmol/L   Potassium 2.8 (L) 3.5 - 5.1 mmol/L   Chloride 101 101 - 111 mmol/L   CO2 26 22 - 32 mmol/L   Glucose, Bld 90 65 - 99 mg/dL   BUN 15 6 - 20 mg/dL   Creatinine, Ser 0.77 0.44 - 1.00 mg/dL   Calcium 8.3 (L) 8.9 - 10.3 mg/dL   Total Protein 5.4 (L) 6.5 - 8.1 g/dL   Albumin 2.3 (L) 3.5 - 5.0 g/dL   AST 23 15 - 41 U/L   ALT 18 14 - 54 U/L   Alkaline Phosphatase 142 (H) 38 - 126 U/L   Total Bilirubin 1.1 0.3 - 1.2 mg/dL   GFR calc non Af Amer >60 >60 mL/min   GFR calc Af Amer >60 >60 mL/min    Comment: (NOTE) The eGFR has been calculated using the CKD EPI equation. This calculation has not been validated in all clinical situations. eGFR's persistently <60 mL/min signify possible Chronic Kidney Disease.  Anion gap 10 5 - 15  CBC WITH DIFFERENTIAL     Status: Abnormal   Collection Time: 01/11/15  3:06 AM  Result Value Ref Range   WBC 4.2 4.0 - 10.5 K/uL   RBC 4.29 3.87 - 5.11 MIL/uL   Hemoglobin 11.3 (L) 12.0 - 15.0 g/dL   HCT 35.4 (L) 36.0 - 46.0 %   MCV 82.5 78.0 - 100.0 fL   MCH 26.3 26.0 - 34.0 pg   MCHC 31.9 30.0 - 36.0 g/dL   RDW 16.0 (H) 11.5 - 15.5 %   Platelets 161 150 - 400 K/uL   Neutrophils Relative % 49 %   Neutro Abs 2.1 1.7 - 7.7 K/uL   Lymphocytes Relative 41 %   Lymphs Abs 1.7 0.7 - 4.0 K/uL   Monocytes Relative 8 %   Monocytes Absolute 0.3 0.1 - 1.0 K/uL   Eosinophils Relative 1 %   Eosinophils Absolute 0.1 0.0 - 0.7 K/uL   Basophils Relative 1 %   Basophils Absolute 0.0 0.0 - 0.1 K/uL  Magnesium     Status: None   Collection Time: 01/11/15  3:06 AM  Result Value Ref Range   Magnesium 1.7 1.7 - 2.4 mg/dL  Troponin I     Status: Abnormal   Collection Time: 01/11/15   6:49 AM  Result Value Ref Range   Troponin I 0.10 (H) <0.031 ng/mL    Comment:        PERSISTENTLY INCREASED TROPONIN VALUES IN THE RANGE OF 0.04-0.49 ng/mL CAN BE SEEN IN:       -UNSTABLE ANGINA       -CONGESTIVE HEART FAILURE       -MYOCARDITIS       -CHEST TRAUMA       -ARRYHTHMIAS       -LATE PRESENTING MYOCARDIAL INFARCTION       -COPD   CLINICAL FOLLOW-UP RECOMMENDED.   Troponin I     Status: Abnormal   Collection Time: 01/11/15  3:28 PM  Result Value Ref Range   Troponin I 0.12 (H) <0.031 ng/mL    Comment:        PERSISTENTLY INCREASED TROPONIN VALUES IN THE RANGE OF 0.04-0.49 ng/mL CAN BE SEEN IN:       -UNSTABLE ANGINA       -CONGESTIVE HEART FAILURE       -MYOCARDITIS       -CHEST TRAUMA       -ARRYHTHMIAS       -LATE PRESENTING MYOCARDIAL INFARCTION       -COPD   CLINICAL FOLLOW-UP RECOMMENDED.   T4, free     Status: Abnormal   Collection Time: 01/11/15  3:28 PM  Result Value Ref Range   Free T4 4.06 (H) 0.61 - 1.12 ng/dL  CBC     Status: Abnormal   Collection Time: 01/12/15  4:47 AM  Result Value Ref Range   WBC 4.9 4.0 - 10.5 K/uL   RBC 4.37 3.87 - 5.11 MIL/uL   Hemoglobin 11.5 (L) 12.0 - 15.0 g/dL   HCT 35.8 (L) 36.0 - 46.0 %   MCV 81.9 78.0 - 100.0 fL   MCH 26.3 26.0 - 34.0 pg   MCHC 32.1 30.0 - 36.0 g/dL   RDW 15.9 (H) 11.5 - 15.5 %   Platelets 169 150 - 400 K/uL  Basic metabolic panel     Status: Abnormal   Collection Time: 01/12/15  4:47 AM  Result Value Ref Range   Sodium 140 135 -  145 mmol/L   Potassium 3.9 3.5 - 5.1 mmol/L   Chloride 105 101 - 111 mmol/L   CO2 25 22 - 32 mmol/L   Glucose, Bld 76 65 - 99 mg/dL   BUN 14 6 - 20 mg/dL   Creatinine, Ser 0.89 0.44 - 1.00 mg/dL   Calcium 8.8 (L) 8.9 - 10.3 mg/dL   GFR calc non Af Amer >60 >60 mL/min   GFR calc Af Amer >60 >60 mL/min    Comment: (NOTE) The eGFR has been calculated using the CKD EPI equation. This calculation has not been validated in all clinical situations. eGFR's  persistently <60 mL/min signify possible Chronic Kidney Disease.    Anion gap 10 5 - 15    Current Facility-Administered Medications  Medication Dose Route Frequency Provider Last Rate Last Dose  . 0.9 %  sodium chloride infusion  250 mL Intravenous PRN Lavina Hamman, MD      . acetaminophen (TYLENOL) tablet 650 mg  650 mg Oral Q4H PRN Lavina Hamman, MD      . aspirin EC tablet 81 mg  81 mg Oral Daily Lavina Hamman, MD   81 mg at 01/12/15 1104  . enoxaparin (LOVENOX) injection 40 mg  40 mg Subcutaneous Q24H Lavina Hamman, MD   40 mg at 01/11/15 1116  . furosemide (LASIX) injection 40 mg  40 mg Intravenous BID Jolaine Artist, MD   40 mg at 01/12/15 0849  . Influenza vac split quadrivalent PF (FLUARIX) injection 0.5 mL  0.5 mL Intramuscular Tomorrow-1000 Lavina Hamman, MD      . lisinopril (PRINIVIL,ZESTRIL) tablet 2.5 mg  2.5 mg Oral Daily Lavina Hamman, MD   2.5 mg at 01/12/15 1104  . methimazole (TAPAZOLE) tablet 5 mg  5 mg Oral BID Orson Eva, MD      . metoprolol succinate (TOPROL-XL) 24 hr tablet 25 mg  25 mg Oral Daily Lavina Hamman, MD   25 mg at 01/12/15 1104  . ondansetron (ZOFRAN) injection 4 mg  4 mg Intravenous Q6H PRN Lavina Hamman, MD      . sodium chloride 0.9 % injection 3 mL  3 mL Intravenous Q12H Lavina Hamman, MD   3 mL at 01/12/15 1105  . sodium chloride 0.9 % injection 3 mL  3 mL Intravenous PRN Lavina Hamman, MD      . spironolactone (ALDACTONE) tablet 25 mg  25 mg Oral Daily Jolaine Artist, MD   25 mg at 01/12/15 1104    Musculoskeletal: Strength & Muscle Tone: decreased Gait & Station: unsteady Patient leans: N/A  Psychiatric Specialty Exam: ROS delusion, weakness and leg swelling  Blood pressure 116/94, pulse 96, temperature 97.4 F (36.3 C), temperature source Oral, resp. rate 16, height 5' 6"  (1.676 m), weight 59.4 kg (130 lb 15.3 oz), SpO2 100 %.Body mass index is 21.15 kg/(m^2).  General Appearance: Guarded  Eye Contact::  Fair  Speech:   Clear and Coherent  Volume:  Normal  Mood:  Depressed and Irritable  Affect:  Congruent and Labile  Thought Process:  Coherent and Goal Directed  Orientation:  Full (Time, Place, and Person)  Thought Content:  Delusions and Paranoid Ideation  Suicidal Thoughts:  No  Homicidal Thoughts:  No  Memory:  Immediate;   Good Recent;   Fair  Judgement:  Impaired  Insight:  Shallow  Psychomotor Activity:  Decreased  Concentration:  Fair  Recall:  [employer] of Knowledge:Fair  Language: Good  Akathisia:  Negative  Handed:  Right  AIMS (if indicated):     Assets:  Communication Skills Desire for Improvement Financial Resources/Insurance Housing Leisure Time Resilience Social Support Transportation  ADL's:  Impaired  Cognition: WNL  Sleep:      Treatment Plan Summary: Daily contact with patient to assess and evaluate symptoms and progress in treatment, Medication management and Plan Patient has no capacity to make her own medical decisions and living arrangements based on my evaluation today. Patient does not understand her current clinical condition does not seek appropriate medical help.  Disposition: Patient has been suffering with chronic paranoid delusions more than one year and currently with the congestive heart failure with elevated QTC. Patient may be better off without taking and the psychiatric medication during this time. Patient will be referred to the outpatient medication management when medically stable.  Patient may take Seroquel 25 mg as needed for aggressive and combative behaviors  Patient does not meet criteria for psychiatric inpatient admission. Supportive therapy provided about ongoing stressors.  Appreciate psychiatric consultation and we sign off Please contact [phone] or [phone] if needs further assistance   JONNALAGADDA,JANARDHAHA R. 01/12/2015 11:05 AM

## 2015-01-12 NOTE — Progress Notes (Signed)
Talked to patient with daughter at bedside. Patient confused difficult to talk to stating " I want an American Doctor." CM informed the patient that we have doctors from all racial backgrounds they are excellent and we cannot discriminate against anyone. Daughter at the bedside, did not leave to go home last night as she stated yesterday- she appears a bit confused, not eating ( patient stated that she has been eating food off of her tray) IT trainer / CM provided food for her daughter.  TCT Carney Bern ( son) updated-patient wants to go home but is not medically stable at this time. HHC choice offered, son chose [facility] for [facility] services. CM also explained the importance of having a PCP once she is discharged home and her issue with "Foreign Doctors." Her son stated that he will come to the hospital today around 4 pm.  Very Difficult situation. CM will continue to follow for DCP. Abelino Derrick [facility] [phone]

## 2015-01-13 DIAGNOSIS — E876 Hypokalemia: Secondary | ICD-10-CM

## 2015-01-13 DIAGNOSIS — Z9119 Patient's noncompliance with other medical treatment and regimen: Secondary | ICD-10-CM

## 2015-01-13 LAB — BASIC METABOLIC PANEL
ANION GAP: 10 (ref 5–15)
BUN: 21 mg/dL — ABNORMAL HIGH (ref 6–20)
CALCIUM: 8.7 mg/dL — AB (ref 8.9–10.3)
CHLORIDE: 101 mmol/L (ref 101–111)
CO2: 27 mmol/L (ref 22–32)
CREATININE: 0.87 mg/dL (ref 0.44–1.00)
GFR calc non Af Amer: >60 mL/min (ref >60)
Glucose, Bld: 97 mg/dL (ref 65–99)
Potassium: 3.7 mmol/L (ref 3.5–5.1)
SODIUM: 138 mmol/L (ref 135–145)

## 2015-01-13 LAB — IRON AND TIBC
IRON: 16 ug/dL — AB (ref 28–170)
SATURATION RATIOS: 6 % — AB (ref 10.4–31.8)
TIBC: 280 ug/dL (ref 250–450)
UIBC: 264 ug/dL

## 2015-01-13 LAB — FERRITIN: Ferritin: 52 ng/mL (ref 11–307)

## 2015-01-13 MED ORDER — SODIUM CHLORIDE 0.9 % IV SOLN
125.0000 mg | Freq: Once | INTRAVENOUS | Status: AC
  Administered 2015-01-13: 125 mg via INTRAVENOUS
  Filled 2015-01-13 (×2): qty 10

## 2015-01-13 MED ORDER — FERROUS SULFATE 325 (65 FE) MG PO TABS
325.0000 mg | ORAL_TABLET | Freq: Two times a day (BID) | ORAL | Status: DC
  Administered 2015-01-13 – 2015-01-15 (×3): 325 mg via ORAL
  Filled 2015-01-13 (×3): qty 1

## 2015-01-13 NOTE — Progress Notes (Addendum)
Upon making morning round at 0800 I found the pt sitting at bedside and the daughter Drinda Butts in recliner eating the pt breakfast tray. Pt daughter Drinda Butts was questioned regarding eating pt tray but could not give an answer to why she ate the pt tray. At 0740 the pt told myself and Greg Cutter RN that she was hungry and asking when the breakfast trays were coming. This caused concern when I found the family member eating the tray rather than the pt. I reinforced that the food that is brought up is for the pt and any additional food needed can be bought down at the cafeteria or if there is financial issues we can discuss meal vouchers with our SW.   Pt instructed of high risk fall potential while hospitalized. Pt refused bed alarm precaution and wishes to "get up ad lib" with risk. Will continue to utilize other fall risk precautions with pt.   Jilda Panda RN

## 2015-01-13 NOTE — Progress Notes (Signed)
Subjective:  Clinically improved, less SOB, diuresing  Objective:  Temp:  [97.8 F (36.6 C)] 97.8 F (36.6 C) (09/29 1252) Pulse Rate:  [85-94] 85 (09/29 1252) Resp:  [18-20] 18 (09/29 1252) BP: (113-130)/(75-85) 113/75 mmHg (09/29 1252) SpO2:  [94 %-99 %] 98 % (09/29 1252) Weight:  [124 lb 6.4 oz (56.427 kg)] 124 lb 6.4 oz (56.427 kg) (09/29 2440) Weight change: -6 lb 8.9 oz (-2.972 kg)  Intake/Output from previous day: 09/28 0701 - 09/29 0700 In: 460 [P.O.:460] Out: 700 [Urine:700]  Intake/Output from this shift: Total I/O In: 460 [P.O.:460] Out: 1550 [Urine:1550]  Physical Exam: General appearance: alert, no distress and slowed mentation Neck: no adenopathy, no carotid bruit, no JVD, supple, symmetrical, trachea midline, thyroid not enlarged, symmetric, no tenderness/mass/nodules and Elevated neck veins to angle of jaw Lungs: clear to auscultation bilaterally Heart: regular rate and rhythm, S1, S2 normal, no murmur, click, rub or gallop Extremities: 2+ pitting edema bilaterally  Lab Results: Results for orders placed or performed during the hospital encounter of 01/10/15 (from the past 48 hour(s))  Troponin I     Status: Abnormal   Collection Time: 01/11/15  3:28 PM  Result Value Ref Range   Troponin I 0.12 (H) <0.031 ng/mL    Comment:        PERSISTENTLY INCREASED TROPONIN VALUES IN THE RANGE OF 0.04-0.49 ng/mL CAN BE SEEN IN:       -UNSTABLE ANGINA       -CONGESTIVE HEART FAILURE       -MYOCARDITIS       -CHEST TRAUMA       -ARRYHTHMIAS       -LATE PRESENTING MYOCARDIAL INFARCTION       -COPD   CLINICAL FOLLOW-UP RECOMMENDED.   T4, free     Status: Abnormal   Collection Time: 01/11/15  3:28 PM  Result Value Ref Range   Free T4 4.06 (H) 0.61 - 1.12 ng/dL  CBC     Status: Abnormal   Collection Time: 01/12/15  4:47 AM  Result Value Ref Range   WBC 4.9 4.0 - 10.5 K/uL   RBC 4.37 3.87 - 5.11 MIL/uL   Hemoglobin 11.5 (L) 12.0 - 15.0 g/dL   HCT 35.8  (L) 36.0 - 46.0 %   MCV 81.9 78.0 - 100.0 fL   MCH 26.3 26.0 - 34.0 pg   MCHC 32.1 30.0 - 36.0 g/dL   RDW 15.9 (H) 11.5 - 15.5 %   Platelets 169 150 - 400 K/uL  Basic metabolic panel     Status: Abnormal   Collection Time: 01/12/15  4:47 AM  Result Value Ref Range   Sodium 140 135 - 145 mmol/L   Potassium 3.9 3.5 - 5.1 mmol/L   Chloride 105 101 - 111 mmol/L   CO2 25 22 - 32 mmol/L   Glucose, Bld 76 65 - 99 mg/dL   BUN 14 6 - 20 mg/dL   Creatinine, Ser 0.89 0.44 - 1.00 mg/dL   Calcium 8.8 (L) 8.9 - 10.3 mg/dL   GFR calc non Af Amer >60 >60 mL/min   GFR calc Af Amer >60 >60 mL/min    Comment: (NOTE) The eGFR has been calculated using the CKD EPI equation. This calculation has not been validated in all clinical situations. eGFR's persistently <60 mL/min signify possible Chronic Kidney Disease.    Anion gap 10 5 - 15  Basic metabolic panel     Status: Abnormal   Collection Time: 01/13/15  3:20 AM  Result Value Ref Range   Sodium 138 135 - 145 mmol/L   Potassium 3.7 3.5 - 5.1 mmol/L   Chloride 101 101 - 111 mmol/L   CO2 27 22 - 32 mmol/L   Glucose, Bld 97 65 - 99 mg/dL   BUN 21 (H) 6 - 20 mg/dL   Creatinine, Ser 0.87 0.44 - 1.00 mg/dL   Calcium 8.7 (L) 8.9 - 10.3 mg/dL   GFR calc non Af Amer >60 >60 mL/min   GFR calc Af Amer >60 >60 mL/min    Comment: (NOTE) The eGFR has been calculated using the CKD EPI equation. This calculation has not been validated in all clinical situations. eGFR's persistently <60 mL/min signify possible Chronic Kidney Disease.    Anion gap 10 5 - 15  Iron and TIBC     Status: Abnormal   Collection Time: 01/13/15  3:20 AM  Result Value Ref Range   Iron 16 (L) 28 - 170 ug/dL   TIBC 280 250 - 450 ug/dL   Saturation Ratios 6 (L) 10.4 - 31.8 %   UIBC 264 ug/dL  Ferritin     Status: None   Collection Time: 01/13/15  3:20 AM  Result Value Ref Range   Ferritin 52 11 - 307 ng/mL    Imaging: Imaging results have been reviewed  Assessment/Plan:     1. Principal Problem: 2.   Paranoia 3. Active Problems: 4.   Anemia 5.   Chronic back pain 6.   Bilateral leg edema 7.   Tobacco abuse 8.   Acute on chronic systolic CHF (congestive heart failure) 9.   Hypokalemia 10.   Elevated troponin 11.   Non compliance with medical treatment 12.   Cardiomyopathy- etiology undetermined 13.   Hyperthyroidism 14.   Time Spent Directly with Patient:  20 minutes  Length of Stay:  LOS: 2 days   Slow improvement. Still has 1-2+ pitting bilat LE edema. JVP elevated. Renal stable. EF 15%. Continue IV diuresis 48 hours then transition to PO ( we will assist with dosing over the weekend). I'm skeptical that she will be medically compliant at home.  Quay Burow 01/13/2015, 1:28 PM

## 2015-01-13 NOTE — Progress Notes (Addendum)
PROGRESS NOTE  Julie Charles ZOX:[id number] DOB: Jul 19, 1935 DOA: 01/10/2015 PCP: No PCP Per Patient  Brief history 79 year old female with a history of systolic CHF, hypertension, hyperthyroidism, cognitive impairment, and iron deficiency anemia presented with worsening leg edema and shortness of breath. The patient is a very difficult and poor historian. Unfortunately, her daughter is not able to contribute any history. Workup revealed fluid overload and decompensated CHF. The patient was started on furosemide with good clinical effect. Assessment/Plan: Acute on chronic systolic CHF -01/11/2015 Echo--EF 15%, diffuse HK -Remains clinically volume overloaded -Continue intravenous furosemide 40 mg twice a day -Dry weight approx 120 -continue I/O and daily weights -neg 7 pounds since admission -continue metoprolol succinate, lisinopril and aldactone -Suspect underlying cardiomyopathy secondary to untreated hypothyroidism but cannot exclude ischemia -Appreciate cardiology -pt with very poor insight, poor health literacy -psychiatry-felt risk > benefit of starting antipsychotics due to pt's cardiomyopathy unless pt becomes agitated or aggressive  Hyperthyroidism -suspect Grave's disease -thyroid ultrasound--pt refused -continue methimazole as her chronic hyperthyroidism may be contributing to her cardiomyopathy -check anti-TPO antibodies, anti-thyroglobulin ab Hypokalemia -repleted Elevated tropoinin -due to decompensated CHF -denies chest pain Iron deficiency anemia -Iron saturation 6%, ferritin 52 -doubt compliance with meds -give dose nulecit and start po iron    Family Communication: Son updated on phone Disposition Plan: Home when medically stable   Procedures/Studies: Dg Chest 2 View  01/10/2015   CLINICAL DATA:  Weakness today.  EXAM: CHEST  2 VIEW  COMPARISON:  February 12, 2010  FINDINGS: The heart size is enlarged. The mediastinal contour is normal. There  is consolidation of right lung base with right pleural effusion. There is minimal left pleural effusion. There is no pulmonary edema. No acute abnormalities identified within the visualized bones.  IMPRESSION: Right lung base pneumonia with right pleural effusion. Minimal left pleural effusion.  Cardiomegaly.   Electronically Signed   By: Sherian Rein M.D.   On: 01/10/2015 22:58         Subjective: Patient denies fevers, chills, headache, chest pain, dyspnea, nausea, vomiting, diarrhea, abdominal pain, dysuria, hematuria   Objective: Filed Vitals:   01/12/15 1137 01/12/15 2021 01/13/15 0632 01/13/15 1252  BP:  130/85 124/84 113/75  Pulse: 88 94 94 85  Temp:  97.8 F (36.6 C) 97.8 F (36.6 C) 97.8 F (36.6 C)  TempSrc:  Oral Oral Oral  Resp: Height:      Weight:   56.427 kg (124 lb 6.4 oz)   SpO2: 89% 99% 94% 98%    Intake/Output Summary (Last 24 hours) at 01/13/15 1614 Last data filed at 01/13/15 1300  Gross per 24 hour  Intake    700 ml  Output   1950 ml  Net  -1250 ml   Weight change: -2.972 kg (-6 lb 8.9 oz) Exam:   General:  Pt is alert, follows commands appropriately, not in acute distress  HEENT: No icterus, No thrush, No neck mass, [place]/AT  Cardiovascular: RRR, S1/S2, no rubs, no gallops  Respiratory: Fine bibasilar crackles. No wheezing.  Abdomen: Soft/+BS, non tender, non distended, no guarding  Extremities: 2+ LE edema, No lymphangitis, No petechiae, No rashes, no synovitis  Data Reviewed: Basic Metabolic Panel:  Recent Labs Lab 01/10/15 2241 01/11/15 0306 01/12/15 0447 01/13/15 0320  NA 137 137 140 138  K 3.0* 2.8* 3.9 3.7  CL 103 101 105 101  CO2 GLUCOSE 96  90 76 97  BUN 21*  CREATININE 0.76 0.77 0.89 0.87  CALCIUM 8.4* 8.3* 8.8* 8.7*  MG  --  1.7  --   --    Liver Function Tests:  Recent Labs Lab 01/10/15 2241 01/11/15 0306  AST 25 23  ALT 19 18  ALKPHOS 146* 142*  BILITOT 1.2 1.1  PROT 5.4*  5.4*  ALBUMIN 2.3* 2.3*   No results for input(s): LIPASE, AMYLASE in the last 168 hours. No results for input(s): AMMONIA in the last 168 hours. CBC:  Recent Labs Lab 01/10/15 2241 01/11/15 0306 01/12/15 0447  WBC 4.8 4.2 4.9  NEUTROABS 2.0 2.1  --   HGB 11.8* 11.3* 11.5*  HCT 37.7 35.4* 35.8*  MCV 83.6 82.5 81.9  PLT 153 161 169   Cardiac Enzymes:  Recent Labs Lab 01/10/15 2314 01/11/15 0306 01/11/15 0649 01/11/15 1528  TROPONINI 0.11* 0.09* 0.10* 0.12*   BNP: Invalid input(s): POCBNP CBG: No results for input(s): GLUCAP in the last 168 hours.  No results found for this or any previous visit (from the past 240 hour(s)).   Scheduled Meds: . aspirin EC  81 mg Oral Daily  . enoxaparin (LOVENOX) injection  40 mg Subcutaneous Q24H  . furosemide  40 mg Intravenous BID  . Influenza vac split quadrivalent PF  0.5 mL Intramuscular Tomorrow-1000  . lisinopril  2.5 mg Oral Daily  . methimazole  5 mg Oral BID  . metoprolol succinate  25 mg Oral Daily  . sodium chloride  3 mL Intravenous Q12H  . spironolactone  25 mg Oral Daily   Continuous Infusions:    TAT, DAVID, DO  [facility] Pager [phone]  If 7PM-7AM, please contact night-coverage [email or web address] Password [email or web address] 01/13/2015, 4:14 PM   LOS: 2 days

## 2015-01-14 DIAGNOSIS — Z720 Tobacco use: Secondary | ICD-10-CM

## 2015-01-14 DIAGNOSIS — R7989 Other specified abnormal findings of blood chemistry: Secondary | ICD-10-CM

## 2015-01-14 LAB — BASIC METABOLIC PANEL
ANION GAP: 11 (ref 5–15)
BUN: 21 mg/dL — ABNORMAL HIGH (ref 6–20)
CHLORIDE: 99 mmol/L — AB (ref 101–111)
CO2: 27 mmol/L (ref 22–32)
Calcium: 8.4 mg/dL — ABNORMAL LOW (ref 8.9–10.3)
Creatinine, Ser: 1.03 mg/dL — ABNORMAL HIGH (ref 0.44–1.00)
GFR calc Af Amer: 59 mL/min — ABNORMAL LOW (ref >60)
GFR, EST NON AFRICAN AMERICAN: 51 mL/min — AB (ref >60)
GLUCOSE: 94 mg/dL (ref 65–99)
POTASSIUM: 3.6 mmol/L (ref 3.5–5.1)
SODIUM: 137 mmol/L (ref 135–145)

## 2015-01-14 LAB — T3, FREE: T3 FREE: 4.6 pg/mL — AB (ref 2.0–4.4)

## 2015-01-14 MED ORDER — LISINOPRIL 2.5 MG PO TABS: 2.5000 mg | ORAL_TABLET | Freq: Every day | ORAL | Status: AC

## 2015-01-14 MED ORDER — METOPROLOL SUCCINATE ER 25 MG PO TB24: 25.0000 mg | ORAL_TABLET | Freq: Every day | ORAL | Status: AC

## 2015-01-14 MED ORDER — SPIRONOLACTONE 25 MG PO TABS: 25.0000 mg | ORAL_TABLET | Freq: Every day | ORAL | Status: AC

## 2015-01-14 MED ORDER — FUROSEMIDE 40 MG PO TABS: 40.0000 mg | ORAL_TABLET | Freq: Every day | ORAL | Status: AC

## 2015-01-14 MED ORDER — FERROUS SULFATE 325 (65 FE) MG PO TABS: 325.0000 mg | ORAL_TABLET | Freq: Two times a day (BID) | ORAL | Status: AC

## 2015-01-14 MED ORDER — METHIMAZOLE 10 MG PO TABS: 10.0000 mg | ORAL_TABLET | Freq: Two times a day (BID) | ORAL | Status: AC

## 2015-01-14 NOTE — Progress Notes (Signed)
Pt changed mind-decided to stay. Needs IV restart.

## 2015-01-14 NOTE — Care Management Important Message (Signed)
Important Message  Patient Details  Name: Julie Charles MRN: [id number] Date of Birth: [birth year]   Medicare Important Message Given:  Yes-second notification given    Orson Aloe 01/14/2015, 3:11 PM

## 2015-01-14 NOTE — Progress Notes (Signed)
Physical Therapy Treatment Patient Details Name: Julie Charles MRN: [id number] DOB: 08-May-1935 Today's Date: 01/14/2015    History of Present Illness Julie Charles is a 79 y.o. female with Past medical history of chronic systolic heart failure, anemia, thyroid disease, essential hypertension.  Admitted with 2 month history of LE swelling.    PT Comments    Rx limited by pt willingness to participate. Continue per POC, as pt tolerates.  Follow Up Recommendations  Supervision/Assistance - 24 hour;SNF;Home health PT     Equipment Recommendations       Recommendations for Other Services       Precautions / Restrictions Precautions Precautions: Fall Restrictions Weight Bearing Restrictions: No    Mobility  Bed Mobility               General bed mobility comments: not tested, pt sitting on edge of bed  Transfers Overall transfer level: Needs assistance   Transfers: Sit to/from Stand;Stand Pivot Transfers Sit to Stand: Supervision Stand pivot transfers: Supervision       General transfer comment: supervision for safety only  Ambulation/Gait Ambulation/Gait assistance: Min guard Ambulation Distance (Feet): 10 Feet Assistive device: None Gait Pattern/deviations: Step-through pattern;Trunk flexed;Decreased stride length     General Gait Details: furniture walking in room, using BSC and sink. Becomes agitated with redirection.   Stairs            Wheelchair Mobility    Modified Rankin (Stroke Patients Only)       Balance                                    Cognition Arousal/Alertness: Awake/alert Behavior During Therapy: Impulsive;Agitated Overall Cognitive Status: No family/caregiver present to determine baseline cognitive functioning                      Exercises      General Comments        Pertinent Vitals/Pain Pain Assessment: No/denies pain    Home Living                      Prior Function             PT Goals (current goals can now be found in the care plan section) Acute Rehab PT Goals Patient Stated Goal: To go home PT Goal Formulation: Patient unable to participate in goal setting Time For Goal Achievement: 01/26/15 Potential to Achieve Goals: Fair Progress towards PT goals: Progressing toward goals (slowly)    Frequency  Min 3X/week    PT Plan Current plan remains appropriate    Co-evaluation             End of Session   Activity Tolerance: Patient limited by fatigue Patient left: in bed;with call bell/phone within reach;with family/visitor present;with bed alarm set (Pt sitting EOB.)     Time: 4098-1191 PT Time Calculation (min) (ACUTE ONLY): 10 min  Charges:  $Therapeutic Activity: 8-22 mins                    G Codes:      Ilda Foil 01/14/2015, 11:03 AM

## 2015-01-14 NOTE — Progress Notes (Signed)
Patient Name: Julie Charles Date of Encounter: 01/14/2015   SUBJECTIVE  Feeling well. No chest pain, sob or palpitations.   CURRENT MEDS . aspirin EC  81 mg Oral Daily  . enoxaparin (LOVENOX) injection  40 mg Subcutaneous Q24H  . ferrous sulfate  325 mg Oral BID WC  . furosemide  40 mg Intravenous BID  . Influenza vac split quadrivalent PF  0.5 mL Intramuscular Tomorrow-1000  . lisinopril  2.5 mg Oral Daily  . methimazole  5 mg Oral BID  . metoprolol succinate  25 mg Oral Daily  . sodium chloride  3 mL Intravenous Q12H  . spironolactone  25 mg Oral Daily    OBJECTIVE  Filed Vitals:   01/13/15 1252 01/13/15 2102 01/14/15 0515 01/14/15 0933  BP: 113/75 98/80 129/80 109/73  Pulse: 85 120 76   Temp: 97.8 F (36.6 C) 98 F (36.7 C) 98.1 F (36.7 C)   TempSrc: Oral Oral Oral   Resp: Height:      Weight:   119 lb 8 oz (54.205 kg)   SpO2: 98% 97% 94%     Intake/Output Summary (Last 24 hours) at 01/14/15 1025 Last data filed at 01/14/15 0847  Gross per 24 hour  Intake   1170 ml  Output   2951 ml  Net  -1781 ml   Filed Weights   01/12/15 0516 01/13/15 0632 01/14/15 0515  Weight: 130 lb 15.3 oz (59.4 kg) 124 lb 6.4 oz (56.427 kg) 119 lb 8 oz (54.205 kg)    PHYSICAL EXAM  General: Pleasant, NAD. Neuro: Alert and oriented X 3. Moves all extremities spontaneously. Psych: Normal affect. HEENT:  Normal  Neck: Supple without bruits or JVD. Lungs:  Resp regular and unlabored, CTA. Heart: RRR no s3, s4, or murmurs. Abdomen: Soft, non-tender, non-distended, BS + x 4.  Extremities: No clubbing, cyanosis. 1-2+ LE edema. DP/PT/Radials 2+ and equal bilaterally.  Accessory Clinical Findings  CBC  Recent Labs  01/12/15 0447  WBC 4.9  HGB 11.5*  HCT 35.8*  MCV 81.9  PLT 169   Basic Metabolic Panel  Recent Labs  01/13/15 0320 01/14/15 0251  NA 138 137  K 3.7 3.6  CL 101 99*  CO2 27 27  GLUCOSE 97 94  BUN 21* 21*  CREATININE 0.87 1.03*  CALCIUM  8.7* 8.4*  Cardiac Enzymes  Recent Labs  01/11/15 1528  TROPONINI 0.12*    Recent Labs  01/13/15 0320  T3FREE 4.6*     Radiology/Studies  Dg Chest 2 View  01/10/2015   CLINICAL DATA:  Weakness today.  EXAM: CHEST  2 VIEW  COMPARISON:  February 12, 2010  FINDINGS: The heart size is enlarged. The mediastinal contour is normal. There is consolidation of right lung base with right pleural effusion. There is minimal left pleural effusion. There is no pulmonary edema. No acute abnormalities identified within the visualized bones.  IMPRESSION: Right lung base pneumonia with right pleural effusion. Minimal left pleural effusion.  Cardiomegaly.   Electronically Signed   By: Sherian Rein M.D.   On: 01/10/2015 22:58    ASSESSMENT AND PLAN Principal Problem:   Paranoia Active Problems:   Anemia   Chronic back pain   Bilateral leg edema   Tobacco abuse   Acute on chronic systolic CHF (congestive heart failure)   Hypokalemia   Elevated troponin   Non compliance with medical treatment   Cardiomyopathy- etiology undetermined   Hyperthyroidism    Plan: Still  has 1-2+ BL LE edema. Diuresed 4.5L. Weight down 15lb. On IV lasix  BID. Creatinine worsen today to 1.03. Follow closely with daily BMET with diuresis. 01/11/2015 Echo--EF 15%, diffuse HK. Plan to continue IV diuretics today and likely switch to PO tomorrow. Consider compression stockings. ?if she would be compliant with her medication at home. She has hyperthyroidism. Being evaluated for Grave's disease. Elevated troponin , likely demand ischemia.   Signed, Manson Passey PA-C Pager [phone]  Agree with note by Chelsea Aus PA-C  Still has 1-2+ pitting edema. Getting IV lasix. I/O neg 4.3 Liters. EF 15%. Can transition to PO and prob home over weekend. Otherwise on approp meds  Runell Gess, M.D., FACP, [facility], [facility] [address] [place], [place]   [address]  [phone] 01/14/2015 10:41 AM

## 2015-01-14 NOTE — Discharge Summary (Signed)
Physician Discharge Summary  Julie Charles ZHY:[id number] DOB: 04/20/1935 DOA: 01/10/2015  PCP: No PCP Per Patient  Admit date: 01/10/2015 Discharge date: 01/14/2015  PATIENT LEAVING AGAINST MEDICAL ADVICE Patient was irate and did not want to stay in the hospital any longer. I spoke with the patient and explained to her the risks involved including but not limited to worsening shortness of breath, dysrhythmia, and death. The patient was belligerent and used profane language throughout the interaction and was adamant upon leaving. The patient's son was at the bedside throughout the entire interaction and could not convince the patient to stay in the hospital. Rx for lisinopril ,aldactone, lasix po, metoprolol succinate, and lisinopril were provided.   Discharge Diagnoses:  Acute on chronic systolic CHF -01/11/2015 Echo--EF 15%, diffuse HK -Remains clinically volume overloaded -Continue intravenous furosemide 40 mg twice a day -Dry weight approx 115-120 -continue I/O and daily weights -neg 11 pounds since admission, NEG 6.2L for the admission -continue metoprolol succinate, lisinopril and aldactone -Suspect underlying cardiomyopathy secondary to untreated hypothyroidism but cannot exclude ischemia -Appreciate cardiology -pt with very poor insight, poor health literacy -psychiatry-felt risk > benefit of starting antipsychotics due to pt's cardiomyopathy unless pt becomes agitated or aggressive  Hyperthyroidism -suspect Grave's disease -thyroid ultrasound--pt refused -continue methimazole as her chronic hyperthyroidism may be contributing to her cardiomyopathy -home with methimazole  bid -check anti-TPO antibodies, anti-thyroglobulin ab--pending at time of d/c Hypokalemia -repleted Elevated tropoinin -due to decompensated CHF -denies chest pain Iron deficiency anemia -Iron saturation 6%, ferritin 52 -doubt compliance with meds -given dose nulecit and started po iron  Discharge  Condition: stable  Disposition: home     Follow-up Information    Follow up with Nanetta Batty, MD.   Specialties:  Cardiology, Radiology   Contact information:   [address] [place] [address] [phone]       Diet:cardiac Wt Readings from Last 3 Encounters:  01/14/15 54.205 kg (119 lb 8 oz)  06/12/14 53.887 kg (118 lb 12.8 oz)  03/24/14 59.421 kg (131 lb)    History of present illness:  79 year old female with a history of systolic CHF, hypertension, hyperthyroidism, cognitive impairment, and iron deficiency anemia presented with worsening leg edema and shortness of breath. The patient is a very difficult and poor historian. Unfortunately, her daughter is not able to contribute any history. Workup revealed fluid overload and decompensated CHF. The patient was started on furosemide with good clinical effect. On 01/14/2015, the patient wanted to leave the hospital AGAINST MEDICAL ADVICE. The patient was quite irate and belligerent. Despite attempts to advise her to stay in the hospital, the patient was adamant upon leaving. Prescription provided for the patient at discharge. Consultants: cardiology  Discharge Exam: Filed Vitals:   01/14/15 1230  BP: 103/87  Pulse: 112  Temp: 97.9 F (36.6 C)  Resp: 18   Filed Vitals:   01/13/15 2102 01/14/15 0515 01/14/15 0933 01/14/15 1230  BP: 98/80 129/80 109/73 103/87  Pulse: 120 76  112  Temp: 98 F (36.7 C) 98.1 F (36.7 C)  97.9 F (36.6 C)  TempSrc: Oral Oral  Oral  Resp: Height:      Weight:  54.205 kg (119 lb 8 oz)    SpO2: 97% 94%  99%   General: Awake and alert, belligerent Cardiovascular: RRR, no rub, no gallop, no S3 Respiratory: Fine bibasilar crackles Abdomen:soft, nontender, nondistended, positive bowel sounds Extremities: 1+LE edema, No lymphangitis, no petechiae  Discharge Instructions  Medication List    STOP taking these medications        feeding supplement  (ENSURE COMPLETE) Liqd     meloxicam 15 MG tablet  Commonly known as:  MOBIC     metoprolol 50 MG tablet  Commonly known as:  LOPRESSOR     nicotine 21 mg/24hr patch  Commonly known as:  NICODERM CQ - dosed in mg/24 hours     Potassium Chloride ER 20 MEQ Tbcr      TAKE these medications        aspirin 81 MG EC tablet  Take 1 tablet (81 mg total) by mouth daily.     ferrous sulfate 325 (65 FE) MG tablet  Take 1 tablet (325 mg total) by mouth 2 (two) times daily with a meal.     furosemide 40 MG tablet  Commonly known as:  LASIX  Take 1 tablet (40 mg total) by mouth daily.     lisinopril 2.5 MG tablet  Commonly known as:  PRINIVIL,ZESTRIL  Take 1 tablet (2.5 mg total) by mouth daily.     methimazole 10 MG tablet  Commonly known as:  TAPAZOLE  Take 1 tablet (10 mg total) by mouth 2 (two) times daily.     metoprolol succinate 25 MG 24 hr tablet  Commonly known as:  TOPROL-XL  Take 1 tablet (25 mg total) by mouth daily.     spironolactone 25 MG tablet  Commonly known as:  ALDACTONE  Take 1 tablet (25 mg total) by mouth daily.         The results of significant diagnostics from this hospitalization (including imaging, microbiology, ancillary and laboratory) are listed below for reference.    Significant Diagnostic Studies: Dg Chest 2 View  01/10/2015   CLINICAL DATA:  Weakness today.  EXAM: CHEST  2 VIEW  COMPARISON:  February 12, 2010  FINDINGS: The heart size is enlarged. The mediastinal contour is normal. There is consolidation of right lung base with right pleural effusion. There is minimal left pleural effusion. There is no pulmonary edema. No acute abnormalities identified within the visualized bones.  IMPRESSION: Right lung base pneumonia with right pleural effusion. Minimal left pleural effusion.  Cardiomegaly.   Electronically Signed   By: Sherian Rein M.D.   On: 01/10/2015 22:58     Microbiology: No results found for this or any previous visit (from the past  240 hour(s)).   Labs: Basic Metabolic Panel:  Recent Labs Lab 01/10/15 2241 01/11/15 0306 01/12/15 0447 01/13/15 0320 01/14/15 0251  NA 137 137 140 138 137  K 3.0* 2.8* 3.9 3.7 3.6  CL 103 101 105 101 99*  CO2 GLUCOSE 96 90 76 97 94  BUN 21* 21*  CREATININE 0.76 0.77 0.89 0.87 1.03*  CALCIUM 8.4* 8.3* 8.8* 8.7* 8.4*  MG  --  1.7  --   --   --    Liver Function Tests:  Recent Labs Lab 01/10/15 2241 01/11/15 0306  AST 25 23  ALT 19 18  ALKPHOS 146* 142*  BILITOT 1.2 1.1  PROT 5.4* 5.4*  ALBUMIN 2.3* 2.3*   No results for input(s): LIPASE, AMYLASE in the last 168 hours. No results for input(s): AMMONIA in the last 168 hours. CBC:  Recent Labs Lab 01/10/15 2241 01/11/15 0306 01/12/15 0447  WBC 4.8 4.2 4.9  NEUTROABS 2.0 2.1  --   HGB 11.8* 11.3* 11.5*  HCT 37.7 35.4* 35.8*  MCV 83.6 82.5 81.9  PLT 153 161 169   Cardiac Enzymes:  Recent Labs Lab 01/10/15 2314 01/11/15 0306 01/11/15 0649 01/11/15 1528  TROPONINI 0.11* 0.09* 0.10* 0.12*   BNP: Invalid input(s): POCBNP CBG: No results for input(s): GLUCAP in the last 168 hours.  Time coordinating discharge:  Greater than 30 minutes  Signed:  TAT, DAVID, DO [facility] Pager: [phone] 01/14/2015, 6:40 PM

## 2015-01-14 NOTE — Progress Notes (Signed)
Pt wanted to go home AMA-signed form. Dr informed. PIV removed. Non tele.

## 2015-01-14 NOTE — Progress Notes (Deleted)
PT Cancellation Note  Patient Details Name: Julie Charles MRN: [id number] DOB: [birth year]   Cancelled Treatment:    Reason Eval/Treat Not Completed: Patient at procedure or test/unavailable. Pt leaving floor for ECHO. Will check back as time allows.   Ilda Foil 01/14/2015, 10:25 AM

## 2015-01-15 LAB — THYROID PEROXIDASE ANTIBODY: Thyroperoxidase Ab SerPl-aCnc: <6 IU/mL (ref 0–34)

## 2015-01-15 LAB — THYROGLOBULIN ANTIBODY: Thyroglobulin Antibody: <1 IU/mL (ref 0.0–0.9)

## 2015-01-15 NOTE — Progress Notes (Signed)
Notes reviewed Cardiology to see as needed over the weekend.  Please call with questions  Hillis Range MD, [facility] 01/15/2015 11:01 AM

## 2015-01-15 NOTE — Progress Notes (Signed)
Patient left against medical advice. Dr. Arbutus Leas at the bedside. Dr. Arbutus Leas and RN explained to patient about the risk of leaving AMA. Pt signed the AMA form and decided to leave. Pt's son will take her home.

## 2015-01-15 NOTE — Progress Notes (Signed)
PROGRESS NOTE  Julie Charles BJY:[id number] DOB: 05/04/1935 DOA: 01/10/2015 PCP: No PCP Per Patient Interim History After the patient was belligerent and threatened to leave AGAINST MEDICAL ADVICE on 01/14/2015, she eventually calmed down and agreed to stay. However on the morning of 01/15/2015, she refused all her medications and wants to leave AGAINST MEDICAL ADVICE again. Again, I spoke with the patient and explained the risks involved including but not limited to worsening shortness of breath, dysrhythmia, and death.  Assessment/Plan:  Acute on chronic systolic CHF -01/11/2015 Echo--EF 15%, diffuse HK -Remains clinically volume overloaded -Continue intravenous furosemide 40 mg twice a day -Dry weight approx 115-120 -continue I/O and daily weights -neg 11 pounds since admission, NEG 6.4L for the admission -continue metoprolol succinate, lisinopril and aldactone -Suspect underlying cardiomyopathy secondary to untreated hypothyroidism but cannot exclude ischemia -Appreciate cardiology -pt with very poor insight, poor health literacy -psychiatry-felt risk > benefit of starting antipsychotics due to pt's cardiomyopathy unless pt becomes agitated or aggressive -01/15/15 patient leaving hospital against medical advice.  Son updated at bedside  Hyperthyroidism -suspect Grave's disease -thyroid ultrasound--pt refused -continue methimazole as her chronic hyperthyroidism may be contributing to her cardiomyopathy -home with methimazole  bid -check anti-TPO antibodies, anti-thyroglobulin ab--pending at time of d/c Hypokalemia -repleted Elevated tropoinin -due to decompensated CHF -denies chest pain Iron deficiency anemia -Iron saturation 6%, ferritin 52 -doubt compliance with meds -given dose nulecit and started po iron   Family Communication:   Discussed with son at beside 10/01 Disposition Plan:   Home when medically stable     Procedures/Studies: Dg Chest 2  View  01/10/2015   CLINICAL DATA:  Weakness today.  EXAM: CHEST  2 VIEW  COMPARISON:  February 12, 2010  FINDINGS: The heart size is enlarged. The mediastinal contour is normal. There is consolidation of right lung base with right pleural effusion. There is minimal left pleural effusion. There is no pulmonary edema. No acute abnormalities identified within the visualized bones.  IMPRESSION: Right lung base pneumonia with right pleural effusion. Minimal left pleural effusion.  Cardiomegaly.   Electronically Signed   By: Sherian Rein M.D.   On: 01/10/2015 22:58         Objective: Filed Vitals:   01/14/15 0933 01/14/15 1230 01/14/15 2039 01/15/15 0603  BP: 109/73 103/87 117/71 125/72  Pulse:  112 79 82  Temp:  97.9 F (36.6 C) 97.3 F (36.3 C) 98.3 F (36.8 C)  TempSrc:  Oral Oral Oral  Resp:  Height:      Weight:    46.4 kg (102 lb 4.7 oz)  SpO2:  99% 96% 100%    Intake/Output Summary (Last 24 hours) at 01/15/15 1153 Last data filed at 01/15/15 0823  Gross per 24 hour  Intake    120 ml  Output   1350 ml  Net  -1230 ml   Weight change: -7.805 kg (-17 lb 3.3 oz) Exam:   General:  Pt is alert, follows commands appropriately, not in acute distress  HEENT: No icterus, No thrush, No neck mass, [place]/AT  Cardiovascular: RRR, S1/S2, no rubs, no gallops  Respiratory: fine bibasilar crackles  Extremities: 2+ edema, No lymphangitis, No petechiae, No rashes, no synovitis  Data Reviewed: Basic Metabolic Panel:  Recent Labs Lab 01/10/15 2241 01/11/15 0306 01/12/15 0447 01/13/15 0320 01/14/15 0251  NA 137 137 140 138 137  K 3.0* 2.8* 3.9 3.7 3.6  CL 103 101  105 101 99*  CO2 GLUCOSE 96 90 76 97 94  BUN 21* 21*  CREATININE 0.76 0.77 0.89 0.87 1.03*  CALCIUM 8.4* 8.3* 8.8* 8.7* 8.4*  MG  --  1.7  --   --   --    Liver Function Tests:  Recent Labs Lab 01/10/15 2241 01/11/15 0306  AST 25 23  ALT 19 18  ALKPHOS 146* 142*  BILITOT 1.2  1.1  PROT 5.4* 5.4*  ALBUMIN 2.3* 2.3*   No results for input(s): LIPASE, AMYLASE in the last 168 hours. No results for input(s): AMMONIA in the last 168 hours. CBC:  Recent Labs Lab 01/10/15 2241 01/11/15 0306 01/12/15 0447  WBC 4.8 4.2 4.9  NEUTROABS 2.0 2.1  --   HGB 11.8* 11.3* 11.5*  HCT 37.7 35.4* 35.8*  MCV 83.6 82.5 81.9  PLT 153 161 169   Cardiac Enzymes:  Recent Labs Lab 01/10/15 2314 01/11/15 0306 01/11/15 0649 01/11/15 1528  TROPONINI 0.11* 0.09* 0.10* 0.12*   BNP: Invalid input(s): POCBNP CBG: No results for input(s): GLUCAP in the last 168 hours.  No results found for this or any previous visit (from the past 240 hour(s)).   Scheduled Meds: . aspirin EC  81 mg Oral Daily  . enoxaparin (LOVENOX) injection  40 mg Subcutaneous Q24H  . ferrous sulfate  325 mg Oral BID WC  . furosemide  40 mg Intravenous BID  . Influenza vac split quadrivalent PF  0.5 mL Intramuscular Tomorrow-1000  . lisinopril  2.5 mg Oral Daily  . methimazole  5 mg Oral BID  . metoprolol succinate  25 mg Oral Daily  . sodium chloride  3 mL Intravenous Q12H  . spironolactone  25 mg Oral Daily   Continuous Infusions:    Seith Aikey, DO  [facility] Pager [phone]  If 7PM-7AM, please contact night-coverage [email or web address] Password [email or web address] 01/15/2015, 11:53 AM   LOS: 4 days

## 2015-01-18 ENCOUNTER — Inpatient Hospital Stay (HOSPITAL_COMMUNITY)
Admission: EM | Admit: 2015-01-18 | Discharge: 2015-02-15 | DRG: 308 | Disposition: E | Payer: Medicare Other | Attending: Pulmonary Disease | Admitting: Pulmonary Disease

## 2015-01-18 ENCOUNTER — Emergency Department (HOSPITAL_COMMUNITY): Payer: Medicare Other

## 2015-01-18 ENCOUNTER — Encounter (HOSPITAL_COMMUNITY): Payer: Self-pay | Admitting: Emergency Medicine

## 2015-01-18 DIAGNOSIS — I5023 Acute on chronic systolic (congestive) heart failure: Secondary | ICD-10-CM | POA: Diagnosis present

## 2015-01-18 DIAGNOSIS — Z515 Encounter for palliative care: Secondary | ICD-10-CM | POA: Diagnosis present

## 2015-01-18 DIAGNOSIS — I255 Ischemic cardiomyopathy: Secondary | ICD-10-CM | POA: Diagnosis present

## 2015-01-18 DIAGNOSIS — N179 Acute kidney failure, unspecified: Secondary | ICD-10-CM | POA: Diagnosis present

## 2015-01-18 DIAGNOSIS — E0590 Thyrotoxicosis, unspecified without thyrotoxic crisis or storm: Secondary | ICD-10-CM | POA: Diagnosis present

## 2015-01-18 DIAGNOSIS — I469 Cardiac arrest, cause unspecified: Secondary | ICD-10-CM | POA: Diagnosis not present

## 2015-01-18 DIAGNOSIS — E876 Hypokalemia: Secondary | ICD-10-CM | POA: Diagnosis present

## 2015-01-18 DIAGNOSIS — J9600 Acute respiratory failure, unspecified whether with hypoxia or hypercapnia: Secondary | ICD-10-CM | POA: Diagnosis present

## 2015-01-18 DIAGNOSIS — I4891 Unspecified atrial fibrillation: Secondary | ICD-10-CM | POA: Diagnosis present

## 2015-01-18 DIAGNOSIS — R4020 Unspecified coma: Secondary | ICD-10-CM | POA: Diagnosis present

## 2015-01-18 DIAGNOSIS — F17210 Nicotine dependence, cigarettes, uncomplicated: Secondary | ICD-10-CM | POA: Diagnosis present

## 2015-01-18 DIAGNOSIS — Z7982 Long term (current) use of aspirin: Secondary | ICD-10-CM | POA: Diagnosis not present

## 2015-01-18 DIAGNOSIS — R570 Cardiogenic shock: Secondary | ICD-10-CM | POA: Diagnosis present

## 2015-01-18 DIAGNOSIS — D509 Iron deficiency anemia, unspecified: Secondary | ICD-10-CM | POA: Diagnosis present

## 2015-01-18 DIAGNOSIS — Z880 Allergy status to penicillin: Secondary | ICD-10-CM | POA: Diagnosis not present

## 2015-01-18 DIAGNOSIS — G253 Myoclonus: Secondary | ICD-10-CM | POA: Diagnosis present

## 2015-01-18 DIAGNOSIS — E872 Acidosis: Secondary | ICD-10-CM | POA: Diagnosis present

## 2015-01-18 DIAGNOSIS — E46 Unspecified protein-calorie malnutrition: Secondary | ICD-10-CM

## 2015-01-18 DIAGNOSIS — Z79899 Other long term (current) drug therapy: Secondary | ICD-10-CM | POA: Diagnosis not present

## 2015-01-18 DIAGNOSIS — G931 Anoxic brain damage, not elsewhere classified: Secondary | ICD-10-CM | POA: Diagnosis present

## 2015-01-18 DIAGNOSIS — I4901 Ventricular fibrillation: Principal | ICD-10-CM | POA: Insufficient documentation

## 2015-01-18 DIAGNOSIS — J9601 Acute respiratory failure with hypoxia: Secondary | ICD-10-CM

## 2015-01-18 DIAGNOSIS — Z66 Do not resuscitate: Secondary | ICD-10-CM | POA: Diagnosis present

## 2015-01-18 DIAGNOSIS — L8990 Pressure ulcer of unspecified site, unspecified stage: Secondary | ICD-10-CM | POA: Insufficient documentation

## 2015-01-18 DIAGNOSIS — R4182 Altered mental status, unspecified: Secondary | ICD-10-CM

## 2015-01-18 LAB — CBC WITH DIFFERENTIAL/PLATELET
BASOS PCT: 0 %
Basophils Absolute: 0 10*3/uL (ref 0.0–0.1)
EOS ABS: 0.4 10*3/uL (ref 0.0–0.7)
EOS PCT: 2 %
HCT: 37.9 % (ref 36.0–46.0)
HEMOGLOBIN: 12.1 g/dL (ref 12.0–15.0)
LYMPHS PCT: 13 %
Lymphs Abs: 2.4 10*3/uL (ref 0.7–4.0)
MCH: 26.4 pg (ref 26.0–34.0)
MCHC: 31.9 g/dL (ref 30.0–36.0)
MCV: 82.6 fL (ref 78.0–100.0)
MONO ABS: 0.5 10*3/uL (ref 0.1–1.0)
Monocytes Relative: 3 %
NEUTROS PCT: 82 %
Neutro Abs: 14.8 10*3/uL — ABNORMAL HIGH (ref 1.7–7.7)
PLATELETS: 119 10*3/uL — AB (ref 150–400)
RBC: 4.59 MIL/uL (ref 3.87–5.11)
RDW: 16.8 % — ABNORMAL HIGH (ref 11.5–15.5)
WBC: 18.1 10*3/uL — AB (ref 4.0–10.5)

## 2015-01-18 LAB — URINALYSIS, ROUTINE W REFLEX MICROSCOPIC
GLUCOSE, UA: NEGATIVE mg/dL
Hgb urine dipstick: NEGATIVE
KETONES UR: NEGATIVE mg/dL
LEUKOCYTES UA: NEGATIVE
NITRITE: NEGATIVE
PROTEIN: 100 mg/dL — AB
Specific Gravity, Urine: 1.019 (ref 1.005–1.030)
Urobilinogen, UA: 4 mg/dL — ABNORMAL HIGH (ref 0.0–1.0)
pH: 7 (ref 5.0–8.0)

## 2015-01-18 LAB — COMPREHENSIVE METABOLIC PANEL
ALBUMIN: 2 g/dL — AB (ref 3.5–5.0)
ALT: 25 U/L (ref 14–54)
AST: 48 U/L — AB (ref 15–41)
Alkaline Phosphatase: 163 U/L — ABNORMAL HIGH (ref 38–126)
Anion gap: 16 — ABNORMAL HIGH (ref 5–15)
BUN: 16 mg/dL (ref 6–20)
CHLORIDE: 103 mmol/L (ref 101–111)
CO2: 19 mmol/L — AB (ref 22–32)
CREATININE: 0.96 mg/dL (ref 0.44–1.00)
Calcium: 7.6 mg/dL — ABNORMAL LOW (ref 8.9–10.3)
GFR calc Af Amer: >60 mL/min (ref >60)
GFR calc non Af Amer: 56 mL/min — ABNORMAL LOW (ref >60)
Glucose, Bld: 124 mg/dL — ABNORMAL HIGH (ref 65–99)
POTASSIUM: 3.4 mmol/L — AB (ref 3.5–5.1)
SODIUM: 138 mmol/L (ref 135–145)
Total Bilirubin: 1 mg/dL (ref 0.3–1.2)
Total Protein: 4.8 g/dL — ABNORMAL LOW (ref 6.5–8.1)

## 2015-01-18 LAB — URINE MICROSCOPIC-ADD ON

## 2015-01-18 LAB — I-STAT CG4 LACTIC ACID, ED: Lactic Acid, Venous: 8.45 mmol/L (ref 0.5–2.0)

## 2015-01-18 LAB — I-STAT ARTERIAL BLOOD GAS, ED
Acid-base deficit: 9 mmol/L — ABNORMAL HIGH (ref 0.0–2.0)
Bicarbonate: 16.8 mEq/L — ABNORMAL LOW (ref 20.0–24.0)
O2 Saturation: 100 %
PCO2 ART: 34.8 mmHg — AB (ref 35.0–45.0)
TCO2: 18 mmol/L (ref 0–100)
pH, Arterial: 7.291 — ABNORMAL LOW (ref 7.350–7.450)
pO2, Arterial: 245 mmHg — ABNORMAL HIGH (ref 80.0–100.0)

## 2015-01-18 LAB — TROPONIN I: Troponin I: 0.07 ng/mL — ABNORMAL HIGH (ref <0.031)

## 2015-01-18 MED ORDER — SODIUM CHLORIDE 0.9 % IV SOLN
25.0000 ug/h | INTRAVENOUS | Status: DC
  Administered 2015-01-19: 50 ug/h via INTRAVENOUS
  Filled 2015-01-18: qty 50

## 2015-01-18 MED ORDER — SODIUM CHLORIDE 0.9 % IV SOLN: 250.0000 mL | INTRAVENOUS | Status: DC | PRN

## 2015-01-18 MED ORDER — DEXTROSE 5 % IV SOLN
0.0000 ug/min | INTRAVENOUS | Status: DC
  Administered 2015-01-18: 20 ug/min via INTRAVENOUS
  Administered 2015-01-18: 4 ug/min via INTRAVENOUS
  Administered 2015-01-19: 15 ug/min via INTRAVENOUS
  Administered 2015-01-19 (×3): 20 ug/min via INTRAVENOUS
  Filled 2015-01-18 (×6): qty 4

## 2015-01-18 MED ORDER — FENTANYL BOLUS VIA INFUSION
25.0000 ug | INTRAVENOUS | Status: DC | PRN
  Filled 2015-01-18: qty 25

## 2015-01-18 MED ORDER — EPINEPHRINE HCL 0.1 MG/ML IJ SOSY
PREFILLED_SYRINGE | INTRAMUSCULAR | Status: DC | PRN
  Administered 2015-01-18: 1 mg via INTRAVENOUS

## 2015-01-18 MED ORDER — FENTANYL CITRATE (PF) 100 MCG/2ML IJ SOLN
50.0000 ug | Freq: Once | INTRAMUSCULAR | Status: AC
  Administered 2015-01-19: 50 ug via INTRAVENOUS

## 2015-01-18 MED ORDER — EPINEPHRINE HCL 0.1 MG/ML IJ SOSY
PREFILLED_SYRINGE | INTRAMUSCULAR | Status: AC | PRN
  Administered 2015-01-18: 1 mg via INTRAVENOUS
  Administered 2015-01-18: 0.2 mg via INTRAVENOUS

## 2015-01-18 MED ORDER — PANTOPRAZOLE SODIUM 40 MG IV SOLR
40.0000 mg | Freq: Every day | INTRAVENOUS | Status: DC
  Administered 2015-01-19: 40 mg via INTRAVENOUS
  Filled 2015-01-18 (×2): qty 40

## 2015-01-18 MED ORDER — SODIUM CHLORIDE 0.9 % IV SOLN: INTRAVENOUS | Status: DC

## 2015-01-18 NOTE — Code Documentation (Addendum)
Pt arrives by [facility] for witness arrest by family, given 2 epi and 1 shocked, 300 mg of amioderone . Initial rhythm v-fib. King airway upno arrival. No pulses noted. cpr initiated.

## 2015-01-18 NOTE — ED Provider Notes (Signed)
CSN: [id number]     Arrival date & time 01/28/2015  2129 History   First MD Initiated Contact with Patient 01/25/2015 2149     Chief Complaint  Patient presents with  . Cardiac Arrest   Patient is a 79 y.o. female presenting with general illness.  Illness Location:  Heart Severity:  Severe Onset quality:  Sudden Duration: minutes. Timing:  Constant Chronicity:  New Context:  Cardiac Arrest/ Found V-FIB Associated symptoms: chest pain and shortness of breath   Chest pain:    Quality:  Unable to specify   Severity:  Unable to specify   Onset quality:  Unable to specify   Timing:  Unable to specify   Past Medical History  Diagnosis Date  . Back pain   . Iron deficiency anemia 10/17/2011  . Allergy   . Arthritis    Past Surgical History  Procedure Laterality Date  . Inner ear surgery     No family history on file. Social History  Substance Use Topics  . Smoking status: Current Every Day Smoker -- 0.50 packs/day    Types: Cigarettes  . Smokeless tobacco: None  . Alcohol Use: No   OB History    Gravida Para Term Preterm AB TAB SAB Ectopic Multiple Living   0 0 0 0 0 0 0 0       Review of Systems  Unable to perform ROS: Patient unresponsive  Respiratory: Positive for shortness of breath.   Cardiovascular: Positive for chest pain.   Allergies  Penicillins and Penicillins  Home Medications   Prior to Admission medications   Medication Sig Start Date End Date Taking? Authorizing Provider  furosemide (LASIX) 40 MG tablet Take 1 tablet (40 mg total) by mouth daily. 01/14/15  Yes Catarina Hartshorn, MD  methimazole (TAPAZOLE) 10 MG tablet Take 1 tablet (10 mg total) by mouth 2 (two) times daily. 01/14/15  Yes Catarina Hartshorn, MD  metoprolol succinate (TOPROL-XL) 25 MG 24 hr tablet Take 1 tablet (25 mg total) by mouth daily. 01/14/15  Yes Catarina Hartshorn, MD  spironolactone (ALDACTONE) 25 MG tablet Take 1 tablet (25 mg total) by mouth daily. 01/14/15  Yes Catarina Hartshorn, MD  aspirin EC 81 MG EC tablet  Take 1 tablet (81 mg total) by mouth daily. Patient not taking: Reported on 01/10/2015 06/12/14   Leroy Sea, MD  ferrous sulfate 325 (65 FE) MG tablet Take 1 tablet (325 mg total) by mouth 2 (two) times daily with a meal. 01/14/15 01/14/16  Catarina Hartshorn, MD  lisinopril (PRINIVIL,ZESTRIL) 2.5 MG tablet Take 1 tablet (2.5 mg total) by mouth daily. 01/14/15   Catarina Hartshorn, MD   BP 98/73 mmHg  Pulse 82  Resp 27  Ht 5\' 2"  (1.575 m)  Wt 101 lb 6.6 oz (46 kg)  BMI 18.54 kg/m2  SpO2 100% Physical Exam  Constitutional:  Cachectic female, unresponsive.  HENT:  Head: Normocephalic.  King Airway In Place  Eyes:  Non reactive pupils, equal  Neck: Neck supple.  Cardiovascular:  No palpable pulse   Pulmonary/Chest:  Course ventilated breath sounds  Abdominal: Soft.  Musculoskeletal:  Left Tibia IO In Place  Neurological:  Unresponsive  Skin: Skin is dry.  Cool   Psychiatric:  Unable to Assess  Nursing note and vitals reviewed.   ED Course  IO LINE INSERTION Date/Time: 01/23/2015 11:35 PM Performed by: Deirdre Peer Authorized by: Deirdre Peer Consent: The procedure was performed in an emergent situation. Patient identity confirmed: arm band Indications: clinical  deterioration, hemodynamic instability, fluid administration, medication administration and rapid vascular access Insertion site: right proximal tibia Insertion device: drill device Insertion: needle was inserted through the bony cortex Number of attempts: 1 Confirmation method: stability of the needle, easy infusion of fluids and aspiration of blood/marrow Secured with: protective shield Patient tolerance: Patient tolerated the procedure well with no immediate complications   (including critical care time) CENTRAL LINE Performed by: Deirdre Peer Consent: The procedure was performed in an emergent situation. Required items: required blood products, implants, devices, and special equipment available Patient  identity confirmed: arm band and provided demographic data Time out: Immediately prior to procedure a "time out" was called to verify the correct patient, procedure, equipment, support staff and site/side marked as required. Indications: vascular access Anesthesia: local infiltration Patient sedated: no Preparation: skin prepped with 2% chlorhexidine Skin prep agent dried: skin prep agent completely dried prior to procedure Sterile barriers: all five maximum sterile barriers used - cap, mask, sterile gown, sterile gloves, and large sterile sheet Hand hygiene: hand hygiene performed prior to central venous catheter insertion  Location details: Femoral  Catheter type: triple lumen Catheter size: 8 Fr Pre-procedure: landmarks identified Ultrasound guidance: No Successful placement: yes Post-procedure: line sutured and dressing applied Assessment: blood return through all parts, free fluid flow Patient tolerance: Patient tolerated the procedure well with no immediate complications.    Labs Review Labs Reviewed  COMPREHENSIVE METABOLIC PANEL - Abnormal; Notable for the following:    Potassium 3.4 (*)    CO2 19 (*)    Glucose, Bld 124 (*)    Calcium 7.6 (*)    Total Protein 4.8 (*)    Albumin 2.0 (*)    AST 48 (*)    Alkaline Phosphatase 163 (*)    GFR calc non Af Amer 56 (*)    Anion gap 16 (*)    All other components within normal limits  TROPONIN I - Abnormal; Notable for the following:    Troponin I 0.07 (*)    All other components within normal limits  CBC WITH DIFFERENTIAL/PLATELET - Abnormal; Notable for the following:    WBC 18.1 (*)    RDW 16.8 (*)    Platelets 119 (*)    Neutro Abs 14.8 (*)    All other components within normal limits  URINALYSIS, ROUTINE W REFLEX MICROSCOPIC (NOT AT [facility]) - Abnormal; Notable for the following:    Color, Urine AMBER (*)    Bilirubin Urine SMALL (*)    Protein, ur 100 (*)    Urobilinogen, UA 4.0 (*)    All other components within  normal limits  URINE MICROSCOPIC-ADD ON - Abnormal; Notable for the following:    Squamous Epithelial / LPF FEW (*)    Bacteria, UA FEW (*)    Casts HYALINE CASTS (*)    All other components within normal limits  I-STAT CG4 LACTIC ACID, ED - Abnormal; Notable for the following:    Lactic Acid, Venous 8.45 (*)    All other components within normal limits  I-STAT ARTERIAL BLOOD GAS, ED - Abnormal; Notable for the following:    pH, Arterial 7.291 (*)    pCO2 arterial 34.8 (*)    pO2, Arterial 245.0 (*)    Bicarbonate 16.8 (*)    Acid-base deficit 9.0 (*)    All other components within normal limits  BRAIN NATRIURETIC PEPTIDE  BASIC METABOLIC PANEL  BASIC METABOLIC PANEL  MAGNESIUM  PHOSPHORUS  LACTIC ACID, PLASMA  PROTIME-INR  CBC  MAGNESIUM  PHOSPHORUS  CBG MONITORING, ED  I-STAT ARTERIAL BLOOD GAS, ED    Imaging Review Dg Chest Port 1 View  02/03/2015   CLINICAL DATA:  Endotracheal tube placement.  Initial encounter.  EXAM: PORTABLE CHEST 1 VIEW  COMPARISON:  Chest radiograph performed 01/10/2015  FINDINGS: The patient's endotracheal tube is seen ending 3-4 cm above the carina.  Mild right basilar airspace opacity reflects persistent pneumonia, perhaps slightly improved from the prior study. The left lung appears relatively clear. Mild vascular congestion is noted. No pleural effusion or pneumothorax is seen.  The cardiomediastinal silhouette is enlarged. No acute osseous abnormalities are identified. External pacing pads are noted.  IMPRESSION: 1. Endotracheal tube seen ending 3-4 cm above the carina. 2. Mild right basilar airspace opacity reflects persistent pneumonia, perhaps slightly improved from the prior study. 3. Mild vascular congestion and cardiomegaly noted.   Electronically Signed   By: Roanna Raider M.D.   On: 02/13/2015 23:03   I have personally reviewed and evaluated these images and lab results as part of my medical decision-making.   EKG Interpretation None       MDM   Patient presents to the ED as Cardiac Arrest. Reportedly, patient grabbed her chest and gasped before collapsing to ground. Patient was admitted to the hospital for Acute CHF by myself a few days prior but left AMA. Patient initially had V-fib arrest and was resuscitated with CPR and epi.  Upon her arrival, patient lost pulse, CPR was initiated and after multiple rounds of epi, regained pulse. Emergent IO, central line, A-line, and ET tube placed. Initially DNR was not known , we spoke with family who agreed with DNR. Patient placed on norepi drip and admitted to ICU for further management.    Final diagnoses:  Acute on chronic systolic CHF (congestive heart failure) (HCC)  Ventricular fibrillation [facility])  Cardiac arrest [facility])      Deirdre Peer, MD 01/17/2015 1610  Lyndal Pulley, MD 02/12/2015 [phone]

## 2015-01-18 NOTE — Code Documentation (Signed)
Absent pulses, cpr restarted.

## 2015-01-18 NOTE — ED Provider Notes (Signed)
INTUBATION Performed by: Urban Gibson  Required items: required blood products, implants, devices, and special equipment available Patient identity confirmed: provided demographic data and hospital-assigned identification number Time out: Immediately prior to procedure a "time out" was called to verify the correct patient, procedure, equipment, support staff and site/side marked as required.  Indications: respiratory failure   Intubation method: direct laryngoscopy   Preoxygenation: nasal cannula, King airway from EMS  Sedatives: none Paralytic: none  Tube Size: cuffed  Post-procedure assessment: chest rise and ETCO2 monitor Breath sounds: equal and absent over the epigastrium Tube secured with: ETT holder Chest x-ray interpreted by radiologist and me.  Chest x-ray findings: endotracheal tube in appropriate position  Patient tolerated the procedure well with no immediate complications.      ARTERIAL LINE Performed by: Urban Gibson Consent: The procedure was performed in an emergent situation. Required items: required blood products, implants, devices, and special equipment available Patient identity confirmed: arm band and provided demographic data Time out: Immediately prior to procedure a "time out" was called to verify the correct patient, procedure, equipment, support staff and site/side marked as required. Indications: frequent blood gas monitoring, pressure monitoring  Anesthesia: local infiltration Local anesthetic: lidocaine 1% without epi Anesthetic total: 1 ml Patient sedated: no Preparation: skin prepped with 2% chlorhexidine Skin prep agent dried: skin prep agent completely dried prior to procedure Sterile barriers: all five maximum sterile barriers used - cap, mask, sterile gown, sterile gloves, and large sterile sheet Hand hygiene: hand hygiene performed prior to central venous catheter insertion  Location details: right radial  Pre-procedure: landmarks  identified Ultrasound guidance: no Successful placement: yes Post-procedure: line sutured and dressing applied Assessment: pulsatile blood flow, free fluid flow, waveform on monitor Patient tolerance: Patient tolerated the procedure well with no immediate complications.   Urban Gibson, MD 02/04/15 2310  Lyndal Pulley, MD 01/18/2015 [phone]

## 2015-01-18 NOTE — ED Notes (Signed)
Pt cleaned and linen changed, all valuables placed in patient belongings bag and given to son at bedsdie.

## 2015-01-18 NOTE — ED Notes (Signed)
Pt status DNR related to previous hospital encounters, MD made aware and to speak with family. Pt HR 79 on  Monitor, pulses present at this time.

## 2015-01-18 NOTE — Code Documentation (Signed)
Levo started at 4 mcg

## 2015-01-18 NOTE — H&P (Signed)
PULMONARY / CRITICAL CARE MEDICINE   Name: Julie Charles MRN: [id number] DOB: [birth year]    ADMISSION DATE:  02/12/2015 CONSULTATION DATE:  01/15/2015  REFERRING MD :  EDP  CHIEF COMPLAINT:  Arrest  INITIAL PRESENTATION: 79 year old female was witnessed cardiac arrest at home. VF initially with ROSC in field be EMS. No pulses on arrival to ED, ACLS started again. Intermittent cardiac arrests. Total estimated downtime as much as 50 mins. On levophed. PCCM to admit.   STUDIES:  CT head 10/4 >>> Echo 9/27 >>> LVEF 15%, Diffuse HK, trivial effusion  SIGNIFICANT EVENTS: 9/27-9/30 > admit for acute on chronic CHF 10/4 Cardiac arrest > admit to ICU on vent, pressors  HISTORY OF PRESENT ILLNESS:  79 year old female with PMH as below, which includes a recent admission 9/27 for acute on chronic CHF during which LVEF was noted to be 15%. Cardiomyopathy thought to be secondary to hyperthyroidism and non-compliance to medical regimen. She was admitted to the hospitalist team and was evaluated by cardiology. She was treated with diuresis (lasix + spironolactone). TSH was low. She refused thyroid ultrasound. T3 T4 mildly elevated. 9/30 she left the hospital against medical advise. She was still volume overloaded at that time. Was started on methimazole.   10/4 She unfortunately suffered a witnessed cardiac arrest at home. No bystander CPR. EMS was called and responded within about 10 mins. She was found to be in VF and had rosc after another 10 mins of ACLS including defibrillation, epi, amiodarone. She was intubated and transported to ED. When she arrived in ED she was noted to be without pulse and CPR was initiated again. It is unclear how long she was without pulses prior to landing in [place]. PEA arrest in ED was about 8 minutes in duration. ED staff estimates could have been up to 50 minutes total time without pulses. She had CVL, art line, and ETT placed in ED. PCCM called for admission.   PAST  MEDICAL HISTORY :   has a past medical history of Back pain; Iron deficiency anemia (10/17/2011); Allergy; and Arthritis.  has past surgical history that includes Inner ear surgery. Prior to Admission medications   Medication Sig Start Date End Date Taking? Authorizing Provider  furosemide (LASIX) 40 MG tablet Take 1 tablet (40 mg total) by mouth daily. 01/14/15  Yes Orson Eva, MD  methimazole (TAPAZOLE) 10 MG tablet Take 1 tablet (10 mg total) by mouth 2 (two) times daily. 01/14/15  Yes Orson Eva, MD  metoprolol succinate (TOPROL-XL) 25 MG 24 hr tablet Take 1 tablet (25 mg total) by mouth daily. 01/14/15  Yes Orson Eva, MD  spironolactone (ALDACTONE) 25 MG tablet Take 1 tablet (25 mg total) by mouth daily. 01/14/15  Yes Orson Eva, MD  aspirin EC 81 MG EC tablet Take 1 tablet (81 mg total) by mouth daily. Patient not taking: Reported on 01/10/2015 06/12/14   Thurnell Lose, MD  ferrous sulfate 325 (65 FE) MG tablet Take 1 tablet (325 mg total) by mouth 2 (two) times daily with a meal. 01/14/15 01/14/16  Orson Eva, MD  lisinopril (PRINIVIL,ZESTRIL) 2.5 MG tablet Take 1 tablet (2.5 mg total) by mouth daily. 01/14/15   Orson Eva, MD   Allergies  Allergen Reactions  . Penicillins     Unknown reaction  . Penicillins Rash    FAMILY HISTORY:  indicated that her mother is deceased. She indicated that her father is deceased.  SOCIAL HISTORY:  reports that she  has been smoking Cigarettes.  She has been smoking about 0.50 packs per day. She does not have any smokeless tobacco history on file. She reports that she does not drink alcohol or use illicit drugs.  REVIEW OF SYSTEMS:  unable  SUBJECTIVE:   VITAL SIGNS: Pulse Rate:  [35-127] 82 (10/04 2240) Resp:  [14-28] 27 (10/04 2240) BP: (78-105)/(51-80) 98/73 mmHg (10/04 2235) SpO2:  [96 %-100 %] 100 % (10/04 2240) Arterial Line BP: (92-114)/(57-74) 111/74 mmHg (10/04 2240) FiO2 (%):  [100 %] 100 % (10/04 2202) Weight:  [46 kg (101 lb 6.6 oz)] 46 kg  (101 lb 6.6 oz) (10/04 2215) HEMODYNAMICS:   VENTILATOR SETTINGS: Vent Mode:  [-] PRVC FiO2 (%):  [100 %] 100 % Set Rate:  [14 bmp] 14 bmp Vt Set:  [400 mL-450 mL] 400 mL PEEP:  [5 cmH20] 5 cmH20 Plateau Pressure:  [21 cmH20] 21 cmH20 INTAKE / OUTPUT: No intake or output data in the 24 hours ending 02/11/2015 2247  PHYSICAL EXAMINATION: General:  Cachectic female in NAD on vent Neuro:  GCS 3T. No pain response HEENT:  [place]/AT, PERRL, mild JVD Cardiovascular:  RRR, no MRG Lungs:  Clear bilateral breath sounds Abdomen:  Soft, non-distended Musculoskeletal:  No acute deformity Skin:  Grossly intact  LABS:  CBC  Recent Labs Lab 01/12/15 0447 02/07/2015 2158  WBC 4.9 18.1*  HGB 11.5* 12.1  HCT 35.8* 37.9  PLT 169 PENDING   Coag's No results for input(s): APTT, INR in the last 168 hours. BMET  Recent Labs Lab 01/12/15 0447 01/13/15 0320 01/14/15 0251  NA 140 138 137  K 3.9 3.7 3.6  CL 105 101 99*  CO2 25 27 27   BUN 14 21* 21*  CREATININE 0.89 0.87 1.03*  GLUCOSE 76 97 94   Electrolytes  Recent Labs Lab 01/12/15 0447 01/13/15 0320 01/14/15 0251  CALCIUM 8.8* 8.7* 8.4*   Sepsis Markers  Recent Labs Lab 02/06/2015 2202  LATICACIDVEN 8.45*   ABG  Recent Labs Lab 02/01/2015 2229  PHART 7.291*  PCO2ART 34.8*  PO2ART 245.0*   Liver Enzymes No results for input(s): AST, ALT, ALKPHOS, BILITOT, ALBUMIN in the last 168 hours. Cardiac Enzymes No results for input(s): TROPONINI, PROBNP in the last 168 hours. Glucose No results for input(s): GLUCAP in the last 168 hours.  Imaging No results found.   ASSESSMENT / PLAN:  PULMONARY A: VDRF s/p cardiac arrest  P:    Full vent support ABG CXR in AM VAP bundle  CARDIOVASCULAR A:  Cardiogenic shock Ventricular fibrillation cardiac arrest Chronic systolic CHF Cardiomyopathy ischemic vs hyperthyroidism induced Elevated troponin   P:  Telemetry monitoring MAP goal 65 mm/Hg Levophed for MAP  goal Echo DNR Trend troponin Trend lactic acid Heparin infusion Holding lasix, metoprolol, spironolactone, lisinopril while in shock.  Consult cardiology/heart failure team in AM Amiodarone gtt  RENAL A:   High AG met acidosis Hypokalemia  P:   Replete K  Follow Bmet  GASTROINTESTINAL A:  No acute issue  P:   NPO Pepcid for SUP  HEMATOLOGIC A:   VTE prophylaxis   P:  Heparin infusion Follow CBC  INFECTIOUS A:   Leukocytosis, suspect this is reactive  P:   Monitor off ABX  ENDOCRINE A:   Hyperthyroid  P:   Holding methimazole TSH T3 and T4   NEUROLOGIC A:   Acute anoxic encephalopathy.   P:   RASS goal: 0 Fentanyl infusion CT head portable EEG Consult neurology in AM  Global:  Patient was DNR prior to arrest today, however due to leaving AMA did not have proper paperwork at home. Discussion was had with Dr. Posey Pronto on previous admission 9/27. See his H&P note. When she arrested family told EMS she was full code. Family has made it clear that they do not agree with her decision, however they will respect it. I ensure them that this is a reasonable approach given how poor her baseline health is, now with cardiac arrest. Son (primary caregiver) informed me that if she isn't improving and the decision is needed to withdrawal life support, he will NEVER be able to make this decision.    FAMILY  - Updates:   - Inter-disciplinary family meet or Palliative Care meeting due by:  10/11

## 2015-01-18 NOTE — Progress Notes (Signed)
   01/30/2015 2215  Clinical Encounter Type  Visited With Family;Health care provider  Visit Type Initial;Code  Referral From Nurse   Chaplain responded to a post-CPR in the ED. Met with the family and offered support and gave son ice water. Chaplain support available as needed.   Jeri Lager, Chaplain 02/01/2015 10:17 PM

## 2015-01-18 NOTE — Procedures (Signed)
Intubation Procedure Note Julie Charles [id number] Nov 01, 1935  Procedure: Intubation Indications: Respiratory insufficiency  Procedure Details Consent: s/p CPR in ED Time Out: Verified patient identification, verified procedure, site/side was marked, verified correct patient position, special equipment/implants available, medications/allergies/relevent history reviewed, required imaging and test results available.  Performed  Maximum sterile technique was used including gloves, gown, hand hygiene and mask.  MAC and 3    Evaluation Hemodynamic Status: Persistent hypotension treated with pressors; O2 sats: stable throughout Patient's Current Condition: unstable Complications: No apparent complications Patient did tolerate procedure well. Chest X-ray ordered to verify placement.  CXR: pending   Called to bedside for pt s/p CPR. Upon arrival, pt being bagged by EMS via LMA. CPR continued, pulse regained. LMA replaced with ETT. Supplies gathered, pt hyperoxygenated. Tube exchanged utilizing DL, in one attempt by MD. Color changed noted, BBS confirmed, chest rise noted, CXR pending. ETT secured, pt placed on MV on documented settings.    Prescilla Sours 01/25/2015

## 2015-01-19 ENCOUNTER — Inpatient Hospital Stay (HOSPITAL_COMMUNITY): Payer: Medicare Other

## 2015-01-19 DIAGNOSIS — I4901 Ventricular fibrillation: Secondary | ICD-10-CM | POA: Insufficient documentation

## 2015-01-19 DIAGNOSIS — L8990 Pressure ulcer of unspecified site, unspecified stage: Secondary | ICD-10-CM | POA: Insufficient documentation

## 2015-01-19 DIAGNOSIS — I469 Cardiac arrest, cause unspecified: Secondary | ICD-10-CM | POA: Insufficient documentation

## 2015-01-19 DIAGNOSIS — R4020 Unspecified coma: Secondary | ICD-10-CM

## 2015-01-19 DIAGNOSIS — R4182 Altered mental status, unspecified: Secondary | ICD-10-CM | POA: Insufficient documentation

## 2015-01-19 LAB — BLOOD GAS, ARTERIAL
ACID-BASE DEFICIT: 10.9 mmol/L — AB (ref 0.0–2.0)
ACID-BASE DEFICIT: 19.2 mmol/L — AB (ref 0.0–2.0)
BICARBONATE: 7.8 meq/L — AB (ref 20.0–24.0)
Bicarbonate: 13.8 mEq/L — ABNORMAL LOW (ref 20.0–24.0)
DRAWN BY: 437071
Drawn by: 437071
FIO2: 0.6
FIO2: 0.4
LHR: 28 {breaths}/min
MECHVT: 400 mL
MECHVT: 400 mL
O2 SAT: 95.8 %
O2 Saturation: 99.3 %
PATIENT TEMPERATURE: 98.6
PEEP/CPAP: 5 cmH2O
PEEP/CPAP: 5 cmH2O
PH ART: 7.166 — AB (ref 7.350–7.450)
PO2 ART: 215 mmHg — AB (ref 80.0–100.0)
PO2 ART: 112 mmHg — AB (ref 80.0–100.0)
Patient temperature: 98.6
RATE: 20 resp/min
TCO2: 14.6 mmol/L (ref 0–100)
TCO2: 8.5 mmol/L (ref 0–100)
pCO2 arterial: 26.4 mmHg — ABNORMAL LOW (ref 35.0–45.0)
pCO2 arterial: 22.5 mmHg — ABNORMAL LOW (ref 35.0–45.0)
pH, Arterial: 7.338 — ABNORMAL LOW (ref 7.350–7.450)

## 2015-01-19 LAB — BASIC METABOLIC PANEL
Anion gap: 26 — ABNORMAL HIGH (ref 5–15)
BUN: 22 mg/dL — AB (ref 6–20)
CO2: 9 mmol/L — ABNORMAL LOW (ref 22–32)
CREATININE: 1.39 mg/dL — AB (ref 0.44–1.00)
Calcium: 8.4 mg/dL — ABNORMAL LOW (ref 8.9–10.3)
Chloride: 102 mmol/L (ref 101–111)
GFR, EST AFRICAN AMERICAN: 41 mL/min — AB (ref >60)
GFR, EST NON AFRICAN AMERICAN: 36 mL/min — AB (ref >60)
Glucose, Bld: 77 mg/dL (ref 65–99)
POTASSIUM: 3.8 mmol/L (ref 3.5–5.1)
SODIUM: 137 mmol/L (ref 135–145)

## 2015-01-19 LAB — COMPREHENSIVE METABOLIC PANEL
ALT: 141 U/L — AB (ref 14–54)
Albumin: 2.3 g/dL — ABNORMAL LOW (ref 3.5–5.0)
Alkaline Phosphatase: 161 U/L — ABNORMAL HIGH (ref 38–126)
Anion gap: 15 (ref 5–15)
BUN: 33 mg/dL — AB (ref 6–20)
CHLORIDE: 96 mmol/L — AB (ref 101–111)
CO2: 20 mmol/L — AB (ref 22–32)
CREATININE: 2.1 mg/dL — AB (ref 0.44–1.00)
Calcium: 8 mg/dL — ABNORMAL LOW (ref 8.9–10.3)
GFR calc Af Amer: 25 mL/min — ABNORMAL LOW (ref >60)
GFR, EST NON AFRICAN AMERICAN: 22 mL/min — AB (ref >60)
Glucose, Bld: 138 mg/dL — ABNORMAL HIGH (ref 65–99)
POTASSIUM: 4.2 mmol/L (ref 3.5–5.1)
SODIUM: 131 mmol/L — AB (ref 135–145)
Total Bilirubin: 2.1 mg/dL — ABNORMAL HIGH (ref 0.3–1.2)
Total Protein: 5.1 g/dL — ABNORMAL LOW (ref 6.5–8.1)

## 2015-01-19 LAB — CBC
HCT: 39.7 % (ref 36.0–46.0)
HEMATOCRIT: 38 % (ref 36.0–46.0)
HEMOGLOBIN: 12.4 g/dL (ref 12.0–15.0)
Hemoglobin: 12.9 g/dL (ref 12.0–15.0)
MCH: 26.5 pg (ref 26.0–34.0)
MCH: 25.7 pg — AB (ref 26.0–34.0)
MCHC: 32.5 g/dL (ref 30.0–36.0)
MCHC: 32.6 g/dL (ref 30.0–36.0)
MCV: 81.7 fL (ref 78.0–100.0)
MCV: 78.7 fL (ref 78.0–100.0)
PLATELETS: 152 10*3/uL (ref 150–400)
Platelets: 120 10*3/uL — ABNORMAL LOW (ref 150–400)
RBC: 4.86 MIL/uL (ref 3.87–5.11)
RBC: 4.83 MIL/uL (ref 3.87–5.11)
RDW: 16.8 % — AB (ref 11.5–15.5)
RDW: 16.6 % — ABNORMAL HIGH (ref 11.5–15.5)
WBC: 20.1 10*3/uL — ABNORMAL HIGH (ref 4.0–10.5)
WBC: 17.9 10*3/uL — ABNORMAL HIGH (ref 4.0–10.5)

## 2015-01-19 LAB — TRIGLYCERIDES: Triglycerides: 94 mg/dL (ref <150)

## 2015-01-19 LAB — PHOSPHORUS
PHOSPHORUS: 7.3 mg/dL — AB (ref 2.5–4.6)
PHOSPHORUS: 5.8 mg/dL — AB (ref 2.5–4.6)

## 2015-01-19 LAB — GLUCOSE, CAPILLARY
Glucose-Capillary: 62 mg/dL — ABNORMAL LOW (ref 65–99)
Glucose-Capillary: 158 mg/dL — ABNORMAL HIGH (ref 65–99)

## 2015-01-19 LAB — POCT I-STAT 3, ART BLOOD GAS (G3+)
Acid-base deficit: 1 mmol/L (ref 0.0–2.0)
BICARBONATE: 21.5 meq/L (ref 20.0–24.0)
O2 Saturation: 99 %
PH ART: 7.48 — AB (ref 7.350–7.450)
PO2 ART: 143 mmHg — AB (ref 80.0–100.0)
TCO2: 22 mmol/L (ref 0–100)
pCO2 arterial: 28.9 mmHg — ABNORMAL LOW (ref 35.0–45.0)

## 2015-01-19 LAB — MAGNESIUM
MAGNESIUM: 2 mg/dL (ref 1.7–2.4)
Magnesium: 1.8 mg/dL (ref 1.7–2.4)

## 2015-01-19 LAB — LACTIC ACID, PLASMA: LACTIC ACID, VENOUS: 3.3 mmol/L — AB (ref 0.5–2.0)

## 2015-01-19 LAB — MRSA PCR SCREENING: MRSA by PCR: NEGATIVE

## 2015-01-19 MED ORDER — AMIODARONE HCL IN DEXTROSE 360-4.14 MG/200ML-% IV SOLN
30.0000 mg/h | INTRAVENOUS | Status: DC
  Administered 2015-01-19 (×2): 30 mg/h via INTRAVENOUS
  Filled 2015-01-19 (×4): qty 200

## 2015-01-19 MED ORDER — CHLORHEXIDINE GLUCONATE 0.12% ORAL RINSE (MEDLINE KIT)
15.0000 mL | Freq: Two times a day (BID) | OROMUCOSAL | Status: DC
  Administered 2015-01-19 (×2): 15 mL via OROMUCOSAL

## 2015-01-19 MED ORDER — HEPARIN (PORCINE) IN NACL 100-0.45 UNIT/ML-% IJ SOLN
550.0000 [IU]/h | INTRAMUSCULAR | Status: DC
  Filled 2015-01-19: qty 250

## 2015-01-19 MED ORDER — SODIUM CHLORIDE 0.9 % IV SOLN
1.0000 mg/h | INTRAVENOUS | Status: DC
  Administered 2015-01-19: 1 mg/h via INTRAVENOUS
  Filled 2015-01-19: qty 10

## 2015-01-19 MED ORDER — HEPARIN BOLUS VIA INFUSION
3000.0000 [IU] | Freq: Once | INTRAVENOUS | Status: DC
  Filled 2015-01-19: qty 3000

## 2015-01-19 MED ORDER — AMIODARONE HCL IN DEXTROSE 360-4.14 MG/200ML-% IV SOLN
60.0000 mg/h | INTRAVENOUS | Status: AC
  Administered 2015-01-19 (×2): 60 mg/h via INTRAVENOUS
  Filled 2015-01-19 (×2): qty 200

## 2015-01-19 MED ORDER — NOREPINEPHRINE BITARTRATE 1 MG/ML IV SOLN
0.0000 ug/min | INTRAVENOUS | Status: DC
  Administered 2015-01-19: 20 ug/min via INTRAVENOUS
  Filled 2015-01-19: qty 16

## 2015-01-19 MED ORDER — ACETAMINOPHEN 325 MG PO TABS
650.0000 mg | ORAL_TABLET | ORAL | Status: DC | PRN
  Administered 2015-01-19: 650 mg
  Filled 2015-01-19: qty 2

## 2015-01-19 MED ORDER — PROPOFOL 1000 MG/100ML IV EMUL
0.0000 ug/kg/min | INTRAVENOUS | Status: DC
  Administered 2015-01-19: 5 ug/kg/min via INTRAVENOUS
  Filled 2015-01-19: qty 100

## 2015-01-19 MED ORDER — PROPOFOL 1000 MG/100ML IV EMUL
0.0000 ug/kg/min | INTRAVENOUS | Status: DC
  Administered 2015-01-19: 5 ug/kg/min via INTRAVENOUS

## 2015-01-19 MED ORDER — ANTISEPTIC ORAL RINSE SOLUTION (CORINZ)
7.0000 mL | Freq: Four times a day (QID) | OROMUCOSAL | Status: DC
  Administered 2015-01-19 (×2): 7 mL via OROMUCOSAL

## 2015-01-19 MED ORDER — DEXTROSE 50 % IV SOLN
25.0000 mL | Freq: Once | INTRAVENOUS | Status: AC
  Administered 2015-01-19: 25 mL via INTRAVENOUS
  Filled 2015-01-19: qty 50

## 2015-01-19 MED ORDER — NAPHAZOLINE-PHENIRAMINE 0.025-0.3 % OP SOLN
2.0000 [drp] | Freq: Four times a day (QID) | OPHTHALMIC | Status: DC | PRN
  Administered 2015-01-19 (×2): 2 [drp] via OPHTHALMIC
  Filled 2015-01-19 (×3): qty 5

## 2015-01-19 MED ORDER — DEXTROSE 50 % IV SOLN
INTRAVENOUS | Status: AC
  Administered 2015-01-19: 25 mL via INTRAVENOUS
  Filled 2015-01-19: qty 50

## 2015-01-19 MED FILL — Medication: Qty: 1 | Status: AC

## 2015-01-19 NOTE — Progress Notes (Signed)
01/29/2015 11:44 AM Contacted hospital chaplain to provide emotional support to family during this time. Chaplain to see family in room.  Tam Savoia, Blanchard Kelch

## 2015-01-19 NOTE — Progress Notes (Addendum)
Pt seen and examined.  79 yo female with hx systolic CHF with EF 15% presented with VF arrest with about 50 minutes before ROSC. Please see H/P from today AM for details.  Pt unresponsive. Still having myoclonal jerks and agonal breathing. Spoke with daughter Drinda Butts at bedside and son Caro Hight (over phone). Caro Hight is getting off work at 3 pm today. He will contact the rest of the family to make decisions about withdrawal maybe later today. She is DNR as per her previous wishes.  Chilton Greathouse MD [facility] Pager [phone] If no answer or after 3pm call: [phone] 01/21/2015, 11:14 AM

## 2015-01-19 NOTE — Progress Notes (Signed)
   01/21/2015 1145  Clinical Encounter Type  Visited With Family  Visit Type Spiritual support  Referral From Nurse  Spiritual Encounters  Spiritual Needs Emotional  Stress Factors  Family Stress Factors Financial concerns;Loss of control;Major life changes  Chaplain responded 1145 to request from nurse to support daughter who had just received negative prognosis for mother. Walked in to unit with patient's sister and found daughter and granddaughter in room. Granddaughter requested help contacting brother, so sought out Child psychotherapist but granddaughter solved problem another way. Walked daughter, who seemed stunned, to lobby and also talked to sister at length. Sister had recently lost brother, as well. Indicated to sister that I might be back at 3:30 when doctor returns. Sister said that patient's daughter may be having medication problems or may be simply shocked by mother's condition. Also indicated daughter has been on street much of her life. Granddaughter had recently lost child and may have been feeling that grief again as she cried throughout my visit. Much strained conversation among family about money.

## 2015-01-19 NOTE — Progress Notes (Signed)
PCCM Interval Progress Note  Asked by Pola Corn MD to meet with family to discuss goals of care.  Chart reviewed.  In short, 79 y.o. F with severe sCHF (EF 15%) who presented 10/4 with VF arrest, approximately 50 minutes before ROSC. Throughout the day 10/5, she had been non responsive then began to have myoclonic jerks for which she was placed on sedation (note, she did not require sedation prior to myoclonus). Sedation has been kept to a minimum in attempt to reduce myoclonic jerks.  She has been on pressors to maintain BP; though levophed has come down to 37mcg/min.  All family has now arrived and they wanted to speak with provider regarding goals of care.   BP 140/63 mmHg  Pulse 77  Temp(Src) 102.5 F (39.2 C) (Oral)  Resp 28  Ht  (1.575 m)  Wt 52.6 kg (115 lb 15.4 oz)  BMI 21.20 kg/m2  SpO2 99%  Gen: chronically ill appearing elderly female, non-responsive. Heart:  RRR, no M/R/G. Lungs: Coarse bilaterally. Neuro:  Sedated for myoclonus, non-responsive.  No gag, no pupils, no corneals.  Does breathe over vent.  Assessment: 79 y.o. F with severe systolic CHF (last EF from Sept 2016 was 15%) who presented 10/4 after witnessed VF arrest.  Downtime before ROSC was approximately 50 minutes.  She was admitted and started on pressors, amiodarone.  On 10/5, she developed myoclonus which is suspected to be due to significant anoxic brain injury.  Discussion / Plan: Family is NOT ready to withdraw life sustaining measures / proceed with comfort care.  They realize and understand how critical Mrs. Dory is that given the severity of her underlying heart disease, her chance of meaningful recovery given her prolonged downtime is extremely small.  They however feel that they need to offer her a fighting chance and they don't feel comfortable proceeding with comfort care measures only.  They understand that she had prior DNR / DNI wishes and although they disagree with those (they infact  asked for proof of DNR paperwork), they will respect her wishes.  They would like to continue with current therapies and they have agreed to NOT ESCALATE care.  We will continue with supportive care for now and re-assess in the AM.  I did inform them that rounding MD in AM may discuss things further with them based on how the night goes. I have ordered basic labs to account for possible electrolyte derangements, etc.  I have ordered an EEG and a CT of the brain tomorrow morning to assess for anoxic brain injury.  Additional CC time:  30 minutes.   Rutherford Guys, [place] [facility] Pager: [phone]  or [phone] February 12, 2015, 7:45 PM

## 2015-01-19 NOTE — Progress Notes (Addendum)
Pt went into v fib arrest @ 2210.  Pt is DNR.  Daughter Drinda Butts notified in waiting room, coming to pt's bedside.  Son, Carney Bern, notified and is on his way to the hospital.  Pt asystole at 2220/08/20, called time of death.   Dr. Arsenio Loader notified.  Confirmation of no heart sounds for one minute by Roselie Awkward, RN and Luther Bradley, RN.

## 2015-01-19 NOTE — Progress Notes (Signed)
Notified Dr. Vaughan Basta in [place] re patient's temp of 102.5 and that family would be here soon for family conference.

## 2015-01-19 NOTE — Progress Notes (Signed)
CRITICAL VALUE ALERT  Critical value received:  Lactic acid 3.3  Date of notification:  01/21/2015  Time of notification:  2055  Critical value read back:Yes.    Nurse who received alert:  Luther Bradley    MD notified (1st page):  Dr. Arsenio Loader   Time of first page:  2101 Called to elink MD

## 2015-01-19 NOTE — Progress Notes (Signed)
eLink Physician-Brief Progress Note Patient Name: Julie Charles DOB: January 06, 1936 MRN: [id number]   Date of Service  2015/02/12  HPI/Events of Note  Lactic Acid = 3.6 which has improved from 8.45.  LVEF = 15%. Prognosis is poor.   eICU Interventions  Continue present management.      Intervention Category Major Interventions: Acid-Base disturbance - evaluation and management  [name] 02-12-15, 9:08 PM

## 2015-01-19 NOTE — Progress Notes (Signed)
ANTICOAGULATION CONSULT NOTE - Initial Consult  Pharmacy Consult for Heparin Indication: chest pain/ACS  Allergies  Allergen Reactions  . Penicillins     Unknown reaction  . Penicillins Rash    Patient Measurements: Height:  (157.5 cm) Weight: 101 lb 6.6 oz (46 kg) IBW/kg (Calculated) : 50.1  Vital Signs: Temp: 98.4 F (36.9 C) (10/04 2300) BP: 107/63 mmHg (10/05 0012) Pulse Rate: 88 (10/05 0012)  Labs:  Recent Labs  02-07-15 2158  HGB 12.1  HCT 37.9  PLT 119*  CREATININE 0.96  TROPONINI 0.07*    Estimated Creatinine Clearance: 35.6 mL/min (by C-G formula based on Cr of 0.96).   Medical History: Past Medical History  Diagnosis Date  . Back pain   . Iron deficiency anemia 10/17/2011  . Allergy   . Arthritis     Medications:  Prescriptions prior to admission  Medication Sig Dispense Refill Last Dose  . furosemide (LASIX) 40 MG tablet Take 1 tablet (40 mg total) by mouth daily. 30 tablet 1 unknown  . methimazole (TAPAZOLE) 10 MG tablet Take 1 tablet (10 mg total) by mouth 2 (two) times daily. 60 tablet 1 unknown  . metoprolol succinate (TOPROL-XL) 25 MG 24 hr tablet Take 1 tablet (25 mg total) by mouth daily. 30 tablet 1 unknown  . spironolactone (ALDACTONE) 25 MG tablet Take 1 tablet (25 mg total) by mouth daily. 30 tablet 1 unknown  . aspirin EC 81 MG EC tablet Take 1 tablet (81 mg total) by mouth daily. (Patient not taking: Reported on 01/10/2015) 30 tablet 1 Not Taking at Unknown time  . ferrous sulfate 325 (65 FE) MG tablet Take 1 tablet (325 mg total) by mouth 2 (two) times daily with a meal. 60 tablet 1   . lisinopril (PRINIVIL,ZESTRIL) 2.5 MG tablet Take 1 tablet (2.5 mg total) by mouth daily. 30 tablet 1     Assessment: 79 y.o. female admitted s/p VF cardiac arrest for heparin  Goal of Therapy:  Heparin level 0.3-0.7 units/ml Monitor platelets by anticoagulation protocol: Yes   Plan:  Heparin 3000 units IV bolus, then start heparin 550  units/hr Check heparin level in 8 hours.   Estephan Gallardo, Gary Fleet 01/23/2015,12:23 AM

## 2015-01-19 NOTE — Progress Notes (Signed)
Notified Dr. Vassie Loll of decreased BP after starting propofol for myoclonic activity.  Levo @ 20, propofol .  Also no urine output for current hour, total 40cc since foley placed.  Order received not to up titrate levo from current rate, no orders for low urine output.  Will continue to monitor.    Roselie Awkward, RN

## 2015-02-03 ENCOUNTER — Telehealth: Payer: Self-pay

## 2015-02-03 NOTE — Telephone Encounter (Signed)
On 02/03/2015 [facility] The death certificate is for burial. The patient is a patient of Doctor Sood. The death certificate will be taken to the pulmonary unit this pm for signature. On 02/03/2015 I received the death certificate back from [facility]. I got the death certificate ready for pickup and called the funeral home and spoke with Leonette Mostharles and told him the death certificate is ready for pickup.

## 2015-02-15 NOTE — Progress Notes (Signed)
125cc fentanyl and 10cc propofol wasted.  Witnessed by Luther Bradley, RN  Roselie Awkward RN

## 2015-02-15 NOTE — Progress Notes (Signed)
Chaplain responded to the request of the Nursing Unit to provide spiritual support to family of patient who had died.  The daughter was at the bedside at the time of death and had a difficult time accepting her Mother's death.  Chaplain offered continued reassurance, and grief support for the daughter, and informed her that her Mother's request for DNR was honored by the Nursing Staff.  Other family members were notified and arrived to spend time with the patient.  Chaplain informed them of all necessary information needed by the Nursing Staff for family contact, and patient placement card given to the family to inform the staff of funeral home selection.  The family was appreciative of all support. Chaplain escorted the family out of the Nursing Unit to the waiting area to leave at their timing.  Chaplain Janell Quiet [phone]

## 2015-02-15 NOTE — Discharge Summary (Signed)
Name:Julie Charles ZOX:[id number] DOB:04/07/1936   ADMISSION DATE: 02/07/2015 DATE OF DEATH: 02/14/2015   79 y.o. F with severe sCHF (EF 15%) who presented 10/4 with VF arrest, approximately 50 minutes before ROSC. Throughout the day 10/5, she had been non responsive then began to have myoclonic jerks for which she was placed on sedation. Sedation has been kept to a minimum in attempt to reduce myoclonic jerks. She has been on pressors to maintain BP; though levophed has come down to 1612mcg/min.  Blood pressure 62/12, pulse 83, temperature 102.8 F (39.3 C), temperature source Oral, resp. rate 0, height 5\' 2"  (1.575 m), weight 115 lb 15.4 oz (52.6 kg), SpO2 100 %.  Gen: chronically ill appearing elderly female, non-responsive. Heart:  RRR, no M/R/G. Lungs: Coarse bilaterally. Neuro:  Sedated for myoclonus, non-responsive.  No gag, no pupils, no corneals.  Does breathe over vent.  Assessment: 79 y.o. F with severe systolic CHF (last EF from Sept 2016 was 15%) who presented 10/4 after witnessed VF arrest.  Downtime before ROSC was approximately 50 minutes.  She was admitted and started on pressors, amiodarone.  On 10/5, she developed myoclonus which is suspected to be due to significant anoxic brain injury.  Multiple discussions were had with the family. Family was not ready to withdraw life sustaining measures / proceed with comfort care.  They realize and understand how critical [name] is that given the severity of her underlying heart disease, her chance of meaningful recovery given her prolonged downtime is extremely small.  They however feel that they need to offer her a fighting chance and they don't feel comfortable proceeding with comfort care measures only.  They understand that she had prior DNR / DNI wishes and they will respect her wishes.  We continued supportive care.  Pt went into V fib arrest at 2210 and then became asystolic at 2222  Chilton GreathousePraveen Oreoluwa Gilmer  MD [facility] Pager [phone] If no answer or after 3pm call: [phone] 02/04/2015, 2:56 PM

## 2015-02-15 DEATH — deceased

## 2015-03-04 ENCOUNTER — Ambulatory Visit: Payer: Self-pay | Admitting: Cardiovascular Disease

## 2016-01-17 IMAGING — US US ABDOMEN COMPLETE
1 series · 13 of 25 positions shown · non-contrast
Comparison: None.

CLINICAL DATA: Acute onset abdominal pain for 1 day.

EXAM:
ULTRASOUND ABDOMEN COMPLETE

[Series 1: us abdomen complete · 0.20mm/px · 13 of 60 slices shown]
[im 1/60]
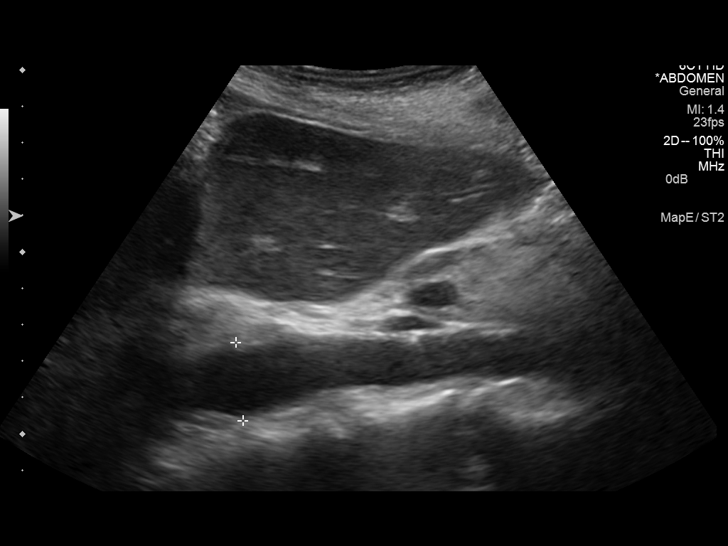
[im 5/60]
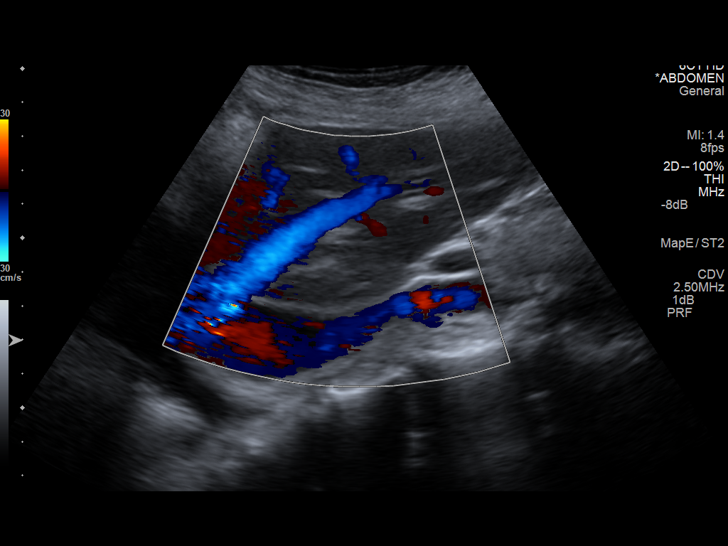
[im 10/60]
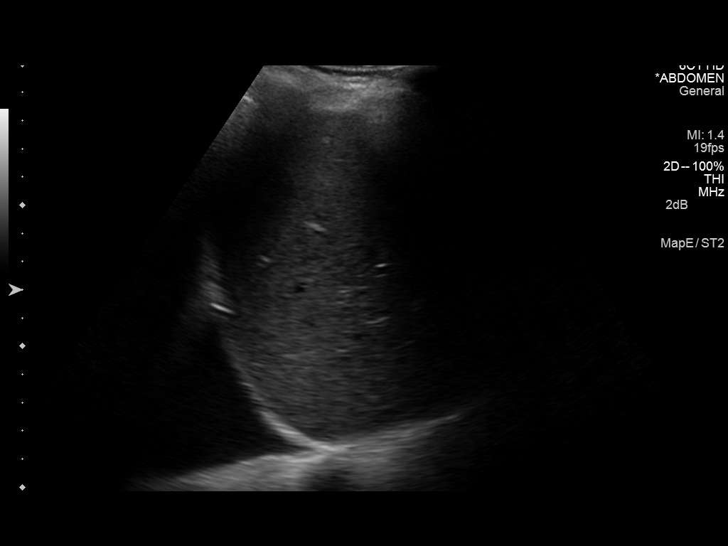
[im 15/60]
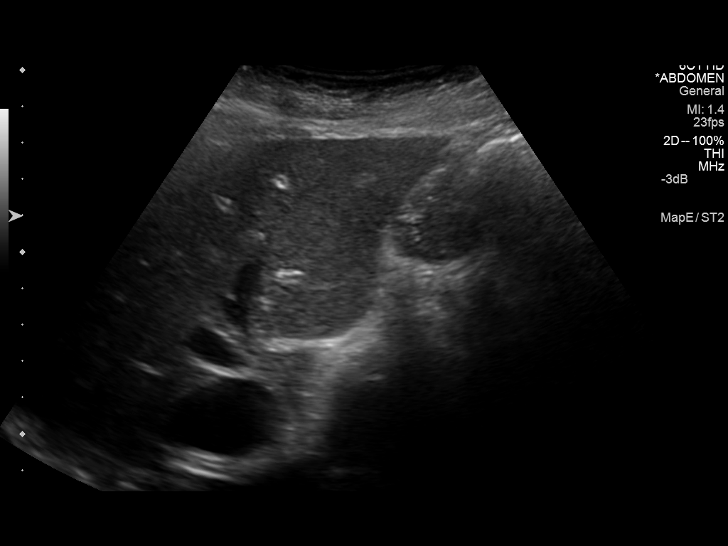
[im 20/60]
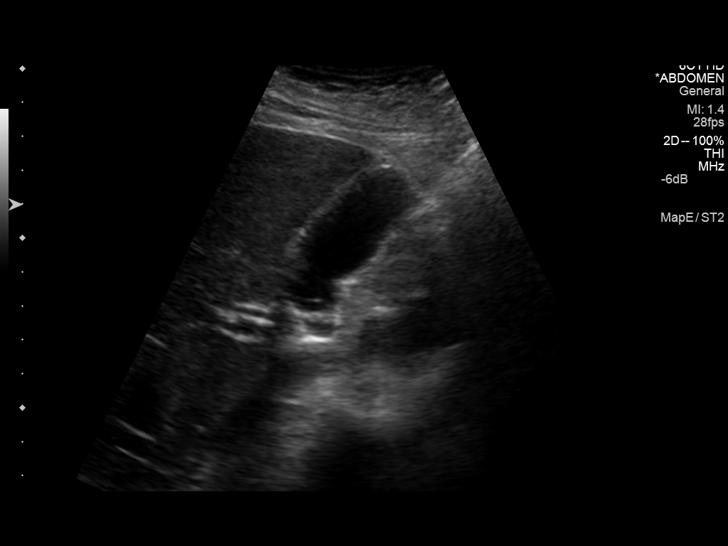
[im 25/60]
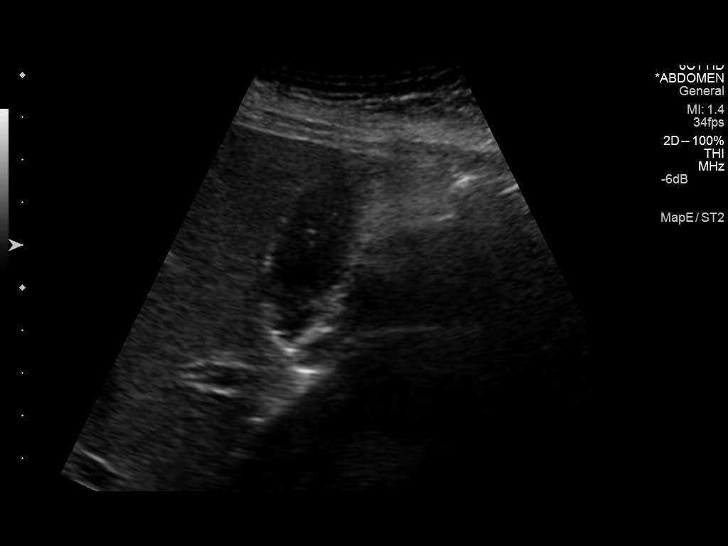
[im 30/60]
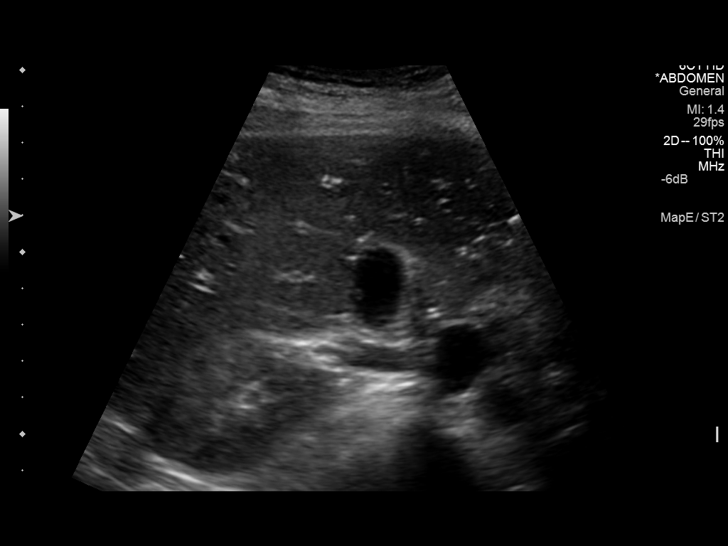
[im 35/60]
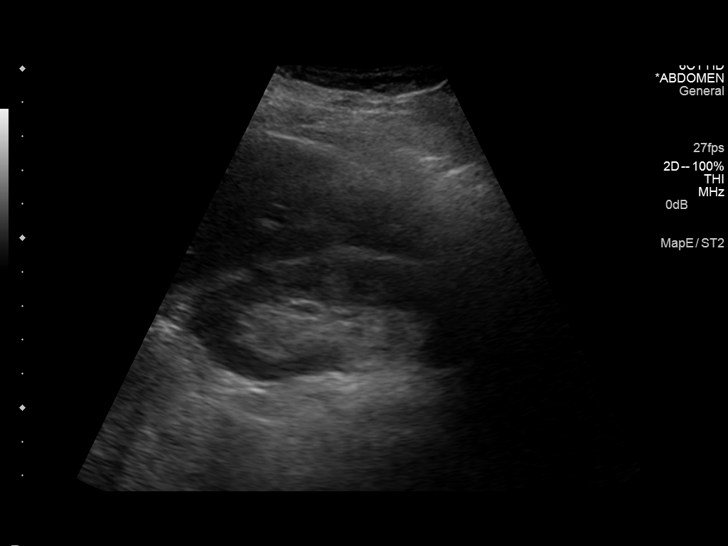
[im 40/60]
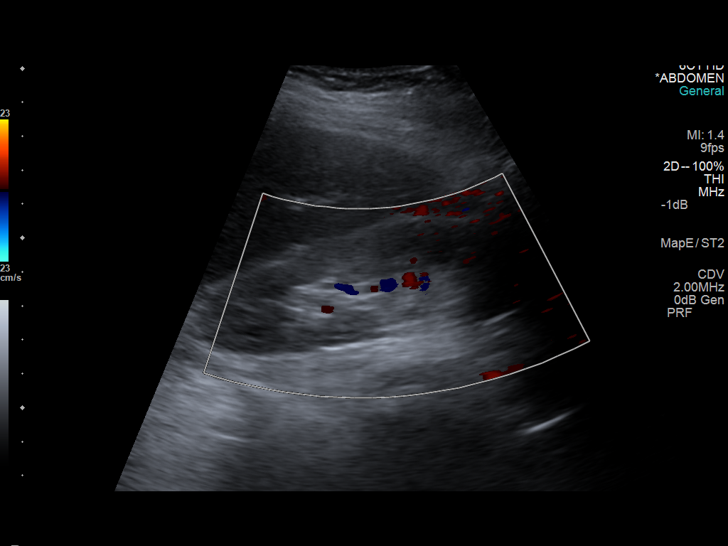
[im 45/60]
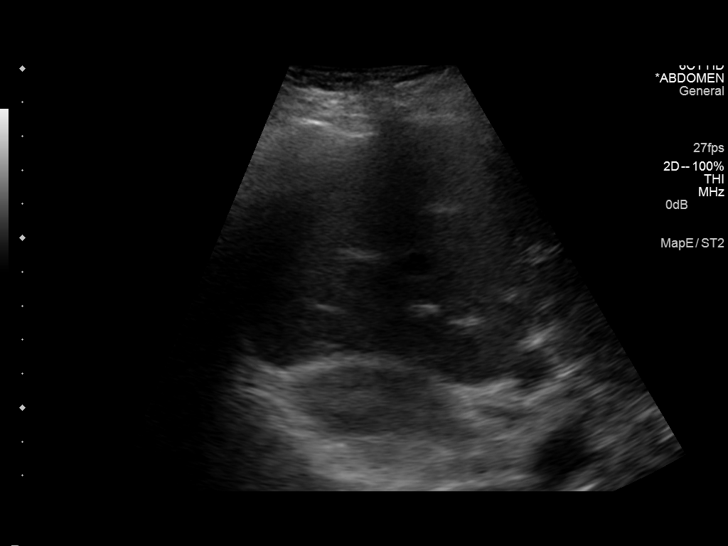
[im 50/60]
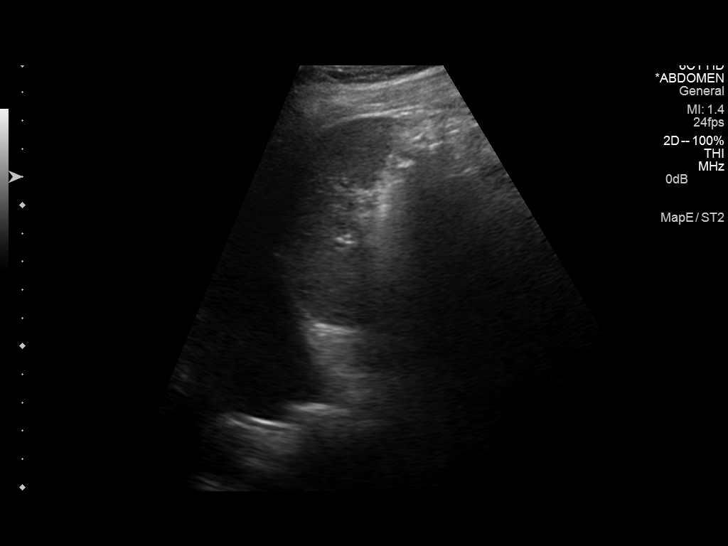
[im 55/60]
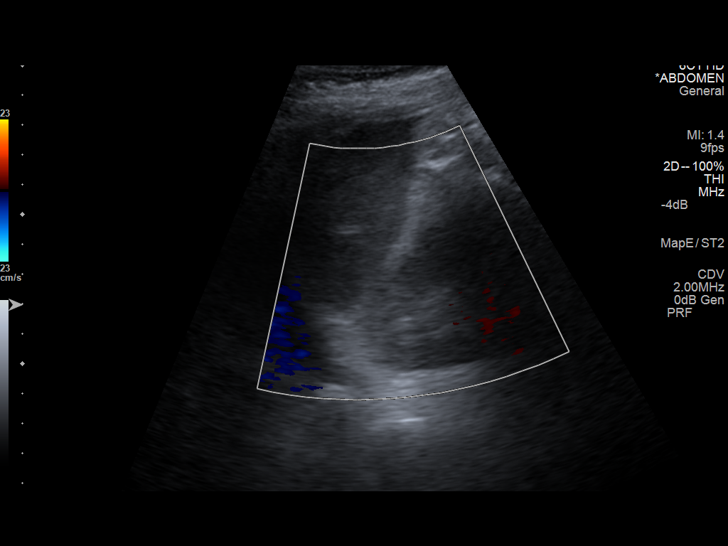
[im 60/60]
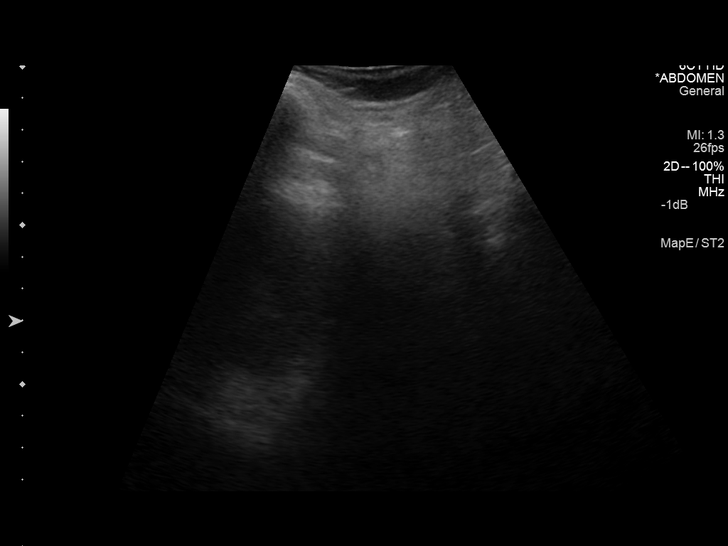

[13 of 25 positions shown; findings below may reference images not displayed]

FINDINGS: Gallbladder: No gallstones or wall thickening visualized. No
sonographic Murphy sign noted. Mild ring down artifact noted from
the nondependent gallbladder wall, consistent with adenomyomatosis,
which is of no clinical significance .

Common bile duct: Diameter: 2 mm, within normal limits.

Liver: No focal lesion identified. Within normal limits in
parenchymal echogenicity.

IVC: No abnormality visualized.

Pancreas: Visualized portion unremarkable.

Spleen: Size and appearance within normal limits.

Right Kidney: Length: 9.2 cm. Echogenicity within normal limits. No
mass or hydronephrosis visualized.

Left Kidney: Present but not well visualized due to overlying bowel
gas and lack of acoustic window.

Abdominal aorta: No aneurysm visualized.

Other findings: Bilateral pleural effusions, right side greater than
left.
IMPRESSION: No evidence of gallstones, biliary ductal dilatation, or other
significant abnormality within the abdomen. Note that left kidney
was not well visualized on this exam.

Bilateral pleural effusions, right side greater than left.

## 2016-08-17 IMAGING — DX DG CHEST 2V
2 series · 2 of 2 positions shown · non-contrast
Comparison: February 12, 2010

CLINICAL DATA: Weakness today.

EXAM:
CHEST  2 VIEW

[w chest lat]
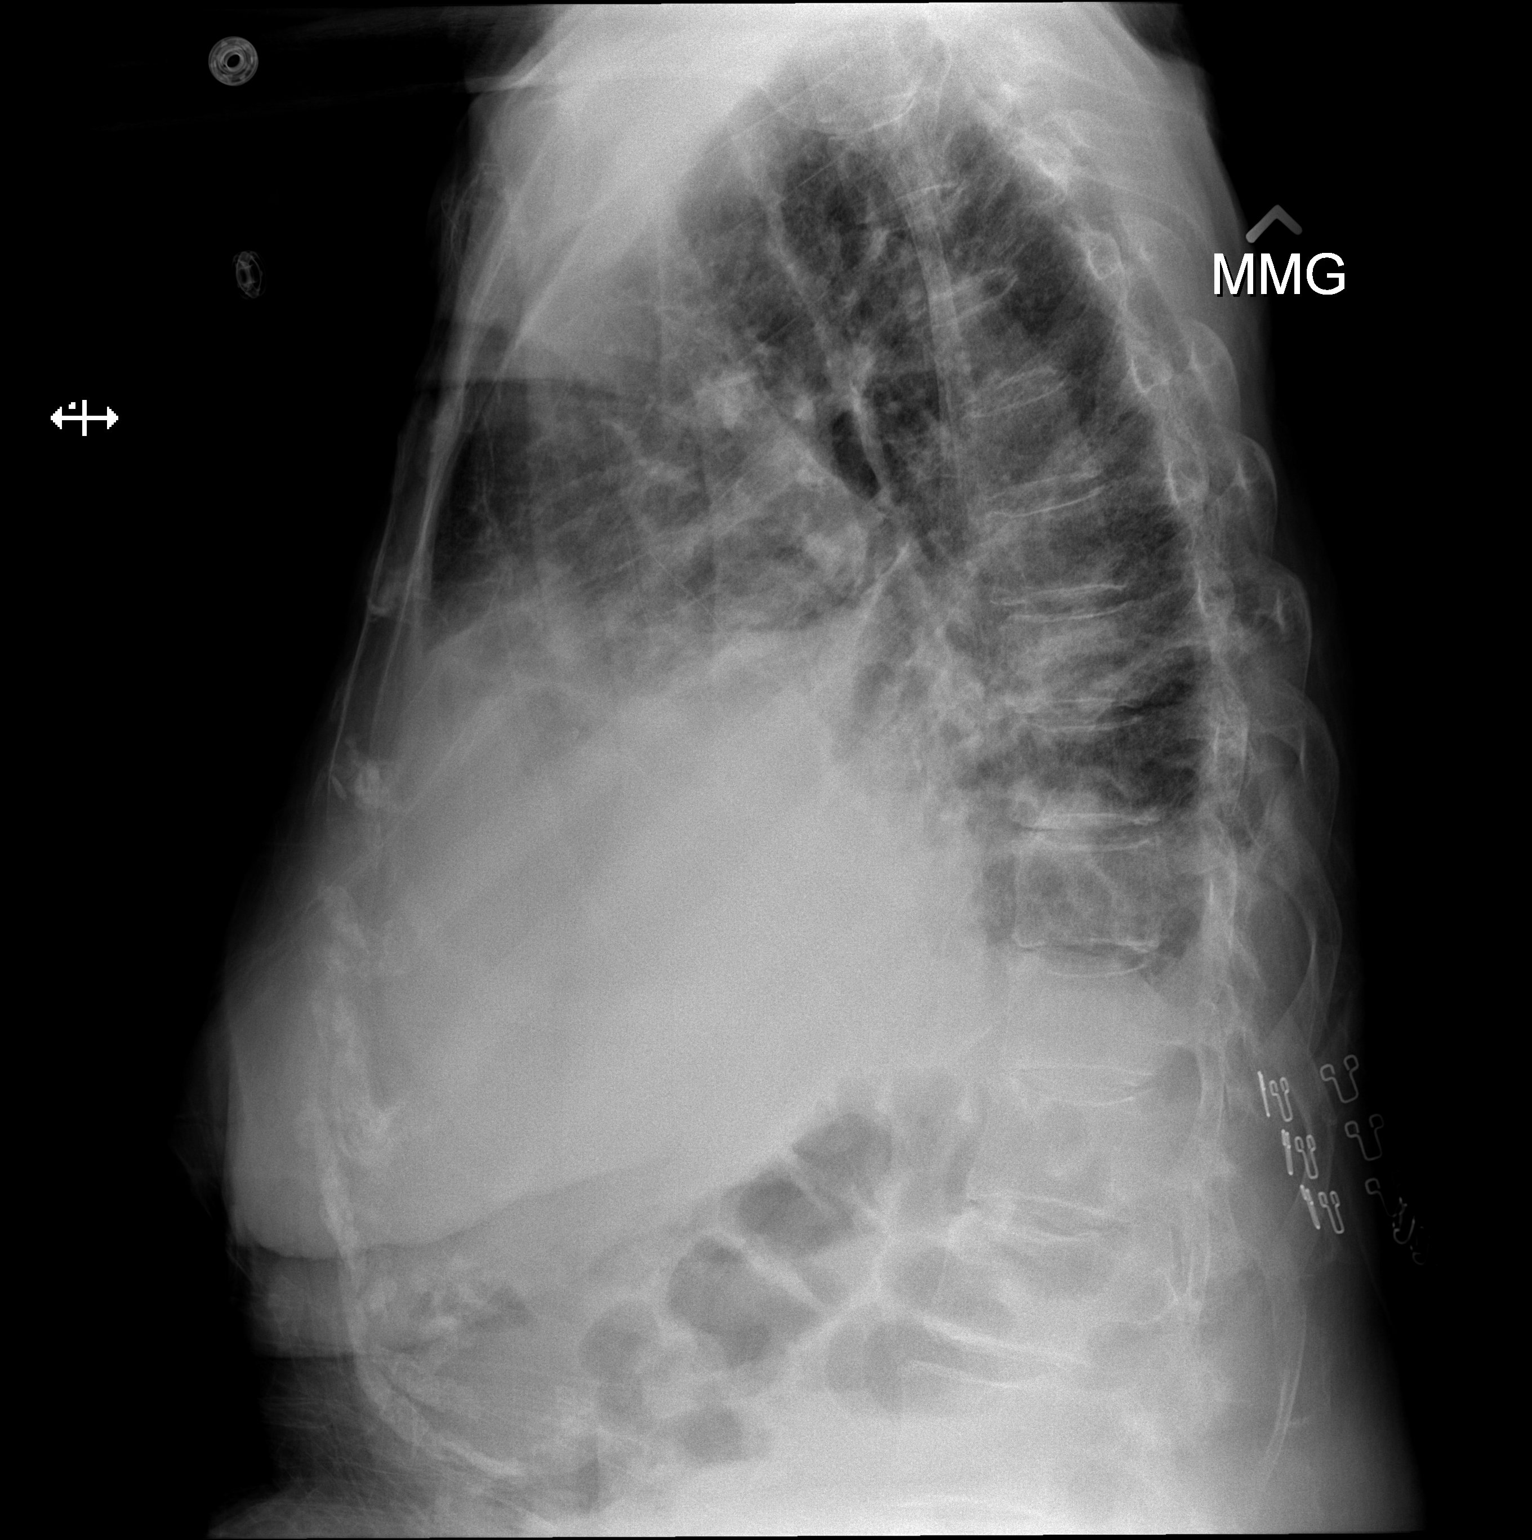

[x chest ap]
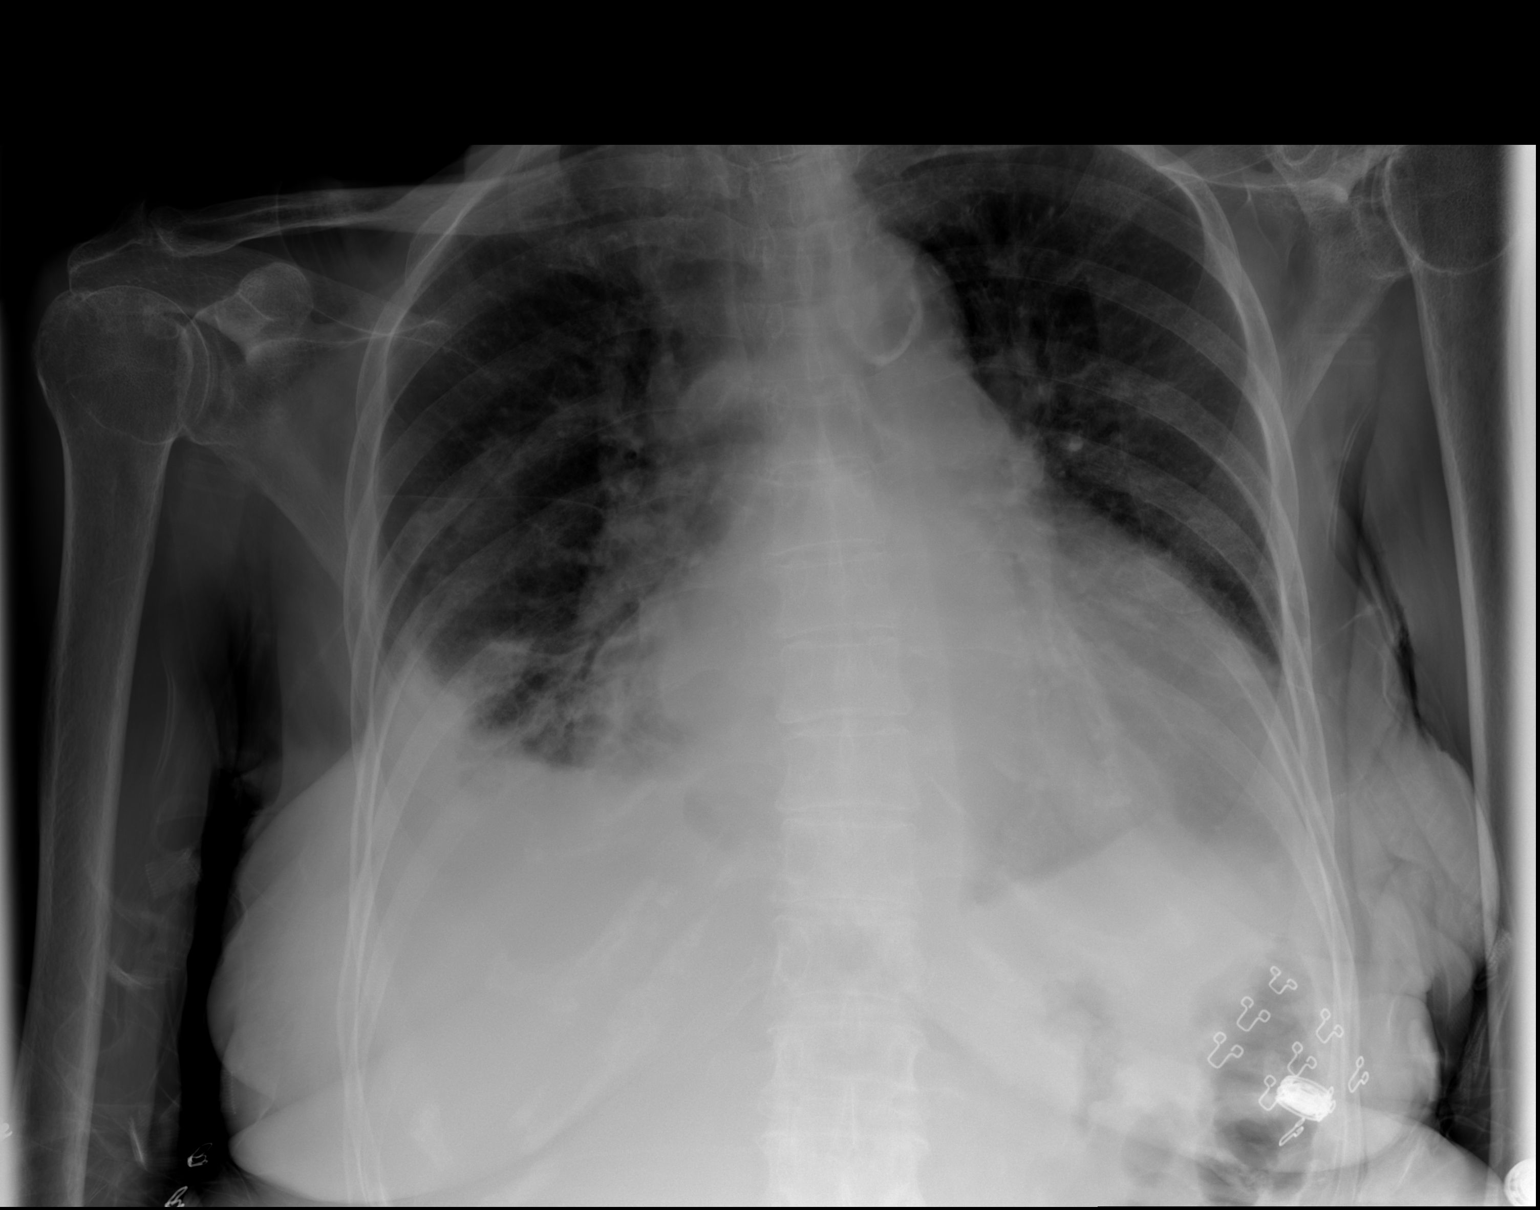

[2 of 2 positions shown; findings below may reference images not displayed]

FINDINGS: The heart size is enlarged. The mediastinal contour is normal. There
is consolidation of right lung base with right pleural effusion.
There is minimal left pleural effusion. There is no pulmonary edema.
No acute abnormalities identified within the visualized bones.
IMPRESSION: Right lung base pneumonia with right pleural effusion. Minimal left
pleural effusion.

Cardiomegaly.
